# Patient Record
Sex: Male | Born: 1965 | Race: White | Hispanic: No | Marital: Married | Smoking: Former smoker
Health system: Southern US, Community
[De-identification: ages and names within clinical notes are randomized; demographics above are authoritative.]

## PROBLEM LIST (undated history)

## (undated) DIAGNOSIS — K219 Gastro-esophageal reflux disease without esophagitis: Secondary | ICD-10-CM

## (undated) DIAGNOSIS — Z91018 Allergy to other foods: Secondary | ICD-10-CM

## (undated) DIAGNOSIS — I4891 Unspecified atrial fibrillation: Secondary | ICD-10-CM

## (undated) DIAGNOSIS — I10 Essential (primary) hypertension: Secondary | ICD-10-CM

## (undated) DIAGNOSIS — K559 Vascular disorder of intestine, unspecified: Secondary | ICD-10-CM

## (undated) HISTORY — DX: Essential (primary) hypertension: I10

## (undated) HISTORY — DX: Allergy to other foods: Z91.018

## (undated) HISTORY — PX: CARDIAC CATHETERIZATION: SHX172

## (undated) HISTORY — DX: Gastro-esophageal reflux disease without esophagitis: K21.9

---

## 1992-06-10 HISTORY — PX: TONSILLECTOMY: SUR1361

## 2006-06-25 ENCOUNTER — Emergency Department: Payer: Self-pay | Admitting: Emergency Medicine

## 2006-07-19 ENCOUNTER — Inpatient Hospital Stay: Payer: Self-pay | Admitting: Internal Medicine

## 2006-08-19 ENCOUNTER — Ambulatory Visit: Payer: Self-pay | Admitting: Internal Medicine

## 2006-08-26 ENCOUNTER — Ambulatory Visit: Payer: Self-pay | Admitting: Vascular Surgery

## 2006-09-09 ENCOUNTER — Ambulatory Visit: Payer: Self-pay | Admitting: Internal Medicine

## 2006-09-11 ENCOUNTER — Ambulatory Visit: Payer: Self-pay | Admitting: Gastroenterology

## 2006-09-19 ENCOUNTER — Ambulatory Visit: Payer: Self-pay | Admitting: Gastroenterology

## 2006-11-24 ENCOUNTER — Ambulatory Visit: Payer: Self-pay | Admitting: Internal Medicine

## 2006-12-02 ENCOUNTER — Ambulatory Visit: Payer: Self-pay | Admitting: Internal Medicine

## 2007-02-10 ENCOUNTER — Ambulatory Visit: Payer: Self-pay | Admitting: Otolaryngology

## 2007-02-24 ENCOUNTER — Ambulatory Visit: Payer: Self-pay | Admitting: Internal Medicine

## 2007-11-25 ENCOUNTER — Ambulatory Visit: Payer: Self-pay | Admitting: Internal Medicine

## 2008-06-09 ENCOUNTER — Ambulatory Visit: Payer: Self-pay | Admitting: Internal Medicine

## 2008-09-24 ENCOUNTER — Emergency Department: Payer: Self-pay | Admitting: Emergency Medicine

## 2008-09-29 ENCOUNTER — Ambulatory Visit: Payer: Self-pay | Admitting: Internal Medicine

## 2009-09-11 ENCOUNTER — Ambulatory Visit: Payer: Self-pay | Admitting: Internal Medicine

## 2010-08-10 ENCOUNTER — Ambulatory Visit: Payer: Self-pay | Admitting: Internal Medicine

## 2010-09-06 ENCOUNTER — Ambulatory Visit: Payer: Self-pay | Admitting: Internal Medicine

## 2012-04-03 ENCOUNTER — Emergency Department: Payer: Self-pay | Admitting: Emergency Medicine

## 2012-04-03 LAB — CBC
HGB: 15.5 g/dL (ref 13.0–18.0)
MCV: 88 fL (ref 80–100)
Platelet: 281 10*3/uL (ref 150–440)
RBC: 5.24 10*6/uL (ref 4.40–5.90)
RDW: 13.2 % (ref 11.5–14.5)
WBC: 11.6 10*3/uL — ABNORMAL HIGH (ref 3.8–10.6)

## 2012-04-03 LAB — BASIC METABOLIC PANEL
BUN: 17 mg/dL (ref 7–18)
Chloride: 107 mmol/L (ref 98–107)
Creatinine: 0.82 mg/dL (ref 0.60–1.30)
EGFR (African American): >60
EGFR (Non-African Amer.): >60
Osmolality: 288 (ref 275–301)
Potassium: 4.1 mmol/L (ref 3.5–5.1)
Sodium: 143 mmol/L (ref 136–145)

## 2012-04-03 LAB — TROPONIN I
Troponin-I: <0.02 ng/mL
Troponin-I: <0.02 ng/mL

## 2012-04-03 LAB — CK TOTAL AND CKMB (NOT AT ARMC): CK, Total: 103 U/L (ref 35–232)

## 2012-04-03 LAB — PROTIME-INR: INR: 0.9

## 2012-09-09 ENCOUNTER — Ambulatory Visit: Payer: Self-pay | Admitting: Internal Medicine

## 2012-10-01 ENCOUNTER — Ambulatory Visit: Payer: Self-pay | Admitting: Internal Medicine

## 2013-03-13 ENCOUNTER — Emergency Department: Payer: Self-pay | Admitting: Emergency Medicine

## 2013-03-13 LAB — CBC
HCT: 43.6 % (ref 40.0–52.0)
MCV: 88 fL (ref 80–100)
RDW: 13.6 % (ref 11.5–14.5)

## 2013-03-13 LAB — BASIC METABOLIC PANEL
BUN: 19 mg/dL — ABNORMAL HIGH (ref 7–18)
Calcium, Total: 8.9 mg/dL (ref 8.5–10.1)
Chloride: 108 mmol/L — ABNORMAL HIGH (ref 98–107)
EGFR (African American): >60
EGFR (Non-African Amer.): >60
Glucose: 114 mg/dL — ABNORMAL HIGH (ref 65–99)
Osmolality: 283 (ref 275–301)
Sodium: 140 mmol/L (ref 136–145)

## 2013-03-13 LAB — CK TOTAL AND CKMB (NOT AT ARMC)
CK, Total: 104 U/L (ref 35–232)
CK-MB: <0.5 ng/mL — ABNORMAL LOW (ref 0.5–3.6)

## 2013-03-14 LAB — TSH: Thyroid Stimulating Horm: 0.538 u[IU]/mL

## 2013-06-25 ENCOUNTER — Ambulatory Visit: Payer: Self-pay | Admitting: Physician Assistant

## 2013-07-19 ENCOUNTER — Other Ambulatory Visit: Payer: Self-pay | Admitting: Gastroenterology

## 2013-07-20 ENCOUNTER — Ambulatory Visit: Payer: Self-pay | Admitting: Gastroenterology

## 2013-07-20 LAB — CLOSTRIDIUM DIFFICILE(ARMC)

## 2013-07-22 LAB — WBCS, STOOL

## 2013-07-24 LAB — STOOL CULTURE

## 2013-08-09 ENCOUNTER — Ambulatory Visit: Payer: Self-pay | Admitting: Gastroenterology

## 2013-08-12 LAB — PATHOLOGY REPORT

## 2013-09-20 ENCOUNTER — Ambulatory Visit: Payer: Self-pay | Admitting: Internal Medicine

## 2013-09-28 ENCOUNTER — Ambulatory Visit: Payer: Self-pay | Admitting: Internal Medicine

## 2013-11-08 DEATH — deceased

## 2014-05-03 ENCOUNTER — Ambulatory Visit: Payer: Self-pay

## 2014-10-26 ENCOUNTER — Other Ambulatory Visit: Payer: Self-pay | Admitting: Cardiovascular Disease

## 2014-12-15 ENCOUNTER — Encounter: Payer: Self-pay | Admitting: Anesthesiology

## 2014-12-15 ENCOUNTER — Ambulatory Visit: Payer: Federal, State, Local not specified - PPO | Attending: Anesthesiology | Admitting: Anesthesiology

## 2014-12-15 VITALS — BP 118/72 | HR 80 | Temp 98.6°F | Resp 18 | Ht 66.0 in | Wt 225.0 lb

## 2014-12-15 DIAGNOSIS — M79605 Pain in left leg: Secondary | ICD-10-CM | POA: Diagnosis not present

## 2014-12-15 DIAGNOSIS — M545 Low back pain: Secondary | ICD-10-CM | POA: Insufficient documentation

## 2014-12-15 DIAGNOSIS — M47817 Spondylosis without myelopathy or radiculopathy, lumbosacral region: Secondary | ICD-10-CM

## 2014-12-15 DIAGNOSIS — M5136 Other intervertebral disc degeneration, lumbar region: Secondary | ICD-10-CM

## 2014-12-15 MED ORDER — IOPAMIDOL (ISOVUE-M 200) INJECTION 41%
INTRAMUSCULAR | Status: AC
  Administered 2014-12-15: 15:00:00
  Filled 2014-12-15: qty 10

## 2014-12-15 MED ORDER — TRIAMCINOLONE ACETONIDE 40 MG/ML IJ SUSP
INTRAMUSCULAR | Status: AC
  Administered 2014-12-15: 15:00:00
  Filled 2014-12-15: qty 1

## 2014-12-15 MED ORDER — MIDAZOLAM HCL 5 MG/5ML IJ SOLN
INTRAMUSCULAR | Status: AC
  Administered 2014-12-15: 4 mg via INTRAVENOUS
  Filled 2014-12-15: qty 5

## 2014-12-15 MED ORDER — ROPIVACAINE HCL 2 MG/ML IJ SOLN
INTRAMUSCULAR | Status: AC
  Administered 2014-12-15: 15:00:00
  Filled 2014-12-15: qty 10

## 2014-12-15 MED ORDER — LIDOCAINE HCL (PF) 1 % IJ SOLN
INTRAMUSCULAR | Status: AC
  Administered 2014-12-15: 15:00:00
  Filled 2014-12-15: qty 5

## 2014-12-15 MED ORDER — SODIUM CHLORIDE 0.9 % IJ SOLN
INTRAMUSCULAR | Status: AC
  Administered 2014-12-15: 15:00:00
  Filled 2014-12-15: qty 10

## 2014-12-15 NOTE — Progress Notes (Signed)
PROCEDURE PERFORMED: L4L5  Lumbar epidural steroid injection under fluroscopic guidance with moderate sedation.  CC:  Severe right greater than Left LBP  HPI: Douglas Parsons was 49 year old white male with a long-standing history of low back pain. Pain is primarily in the central low back with radiation left lateral posterior lateral leg. He also has pain on prolonged standing with extension of the low back. He rates his pain at a pain score 18 on average and right now it is a 2 and maximizes at about a 7 or 8 generally worse with activity and after activity. Aggravating factors include bending climbing sitting standing for prolonged periods of time as well as walking and rest seems to help his back. He notices fatigue and anterior thigh muscle weakness along with an aching constant burning pain. He's had a previous MRI which was reviewed by me today which is approximately 38-year-old. It shows multilevel degenerative disc disease. There is also evidence of multilevel facet arthropathy and spondylolisthesis.  He has had previous epidural Sterritt injections in the past and what he describes as facet injections as well. These are giving him good relief. A were greater than a year ago and he presents today requesting the same as per referral. His past medical history is significant for hypertension hyperlipidemia depression ischemic colitis pneumonia sleep apnea history of stomach ulcers and coronary atherosclerosis status post cardiac catheter with peripheral vascular disease  Past surgical history is for tonsillectomy and cardiac catheter  Social history he is married  Medications include aspirin cholecalciferol ibuprofen magnesium metoprolol multivitamin and Protonix  Allergies are to Crestor and Lipitor lovastatin and Zocor      Physical Exam:    PERRL, EOMI  Heart RRR   LCTA  Musculoskeletal: With the patient in the standing position he does have significant pain with extension to the both  the left and right sides that does produce pain radiating into the posterior leg but not beyond the knee. He does have a positive straight leg raise on the left side. His muscle tone and bulk is good. He does have slightly diminished strength with flexion at the bilateral hips rated a 5 minus over 5. Assessment:  PLAN:   1. Will plan on LESI today with return to clinic in one month.  Risks and benefits of the procedure discussed in full detail. All Questions answered.    2.  The patient is to return for reevaluation in approximately one month. They have been instructed to continue follow-up with their primary care physician regarding their baseline medical care.  3..  Cont back stretching and strengthening exercises.    4.   We'll also consider facet injection in his future visits.  Procedure:      Procedure:  LESI:  NOTE: The risks, benefits, and expectations of the procedure have been discussed and explained to the patient who was understanding and in agreement with suggested treatment plan. No guarantees were made.  DESCRIPTION OF PROCEDURE: Lumbar epidural steroid injection with IV 4 mg Versed, EKG, blood pressure, pulse, and pulse oximetry monitoring. The procedure was performed with the patient in the prone position under fluoroscopic guidance. A local anesthetic skin wheal of 1.5% plain lidocaine at L4-5 was performed at the appropriate site after fluoroscopic identifictation  Using strict aseptic technique, I then advanced an 18-gauge Tuohy epidural needle in the midline via loss-of-resistance  with minimal advancement past loss-of-resistance there was clear discharge consistent with CSF. I have ordered at this level and then proceeded to the  L5-S1 level. I once again advanced the needle under strict aseptic technique without difficulty. Needle confirmation was both in the AP and lateral views. I then injected 2 cc of Isovue-200 getting good epidural spread primarily left-sided with what  appears to be the L4 and L5 nerve roots. There was also negative aspiration for heme or CSF.  A total of 5 mL of Preservative-Free normal saline with 40 mg of Kenalog and 1cc  bupivacaine 0.25 percent was injected incrementally via the  epidurally placed needle. Needle removed. The patient tolerated the injection well.   @Douglas  [name], MD@

## 2014-12-15 NOTE — Progress Notes (Signed)
Safety precautions to be maintained throughout the outpatient stay will include: orient to surroundings, keep bed in low position, maintain call bell within reach at all times, provide assistance with transfer out of bed and ambulation.  

## 2014-12-15 NOTE — Patient Instructions (Signed)
GENERAL RISKS AND COMPLICATIONS  What are the risk, side effects and possible complications? Generally speaking, most procedures are safe.  However, with any procedure there are risks, side effects, and the possibility of complications.  The risks and complications are dependent upon the sites that are lesioned, or the type of nerve block to be performed.  The closer the procedure is to the spine, the more serious the risks are.  Great care is taken when placing the radio frequency needles, block needles or lesioning probes, but sometimes complications can occur. 1. Infection: Any time there is an injection through the skin, there is a risk of infection.  This is why sterile conditions are used for these blocks.  There are four possible types of infection. 1. Localized skin infection. 2. Central Nervous System Infection-This can be in the form of Meningitis, which can be deadly. 3. Epidural Infections-This can be in the form of an epidural abscess, which can cause pressure inside of the spine, causing compression of the spinal cord with subsequent paralysis. This would require an emergency surgery to decompress, and there are no guarantees that the patient would recover from the paralysis. 4. Discitis-This is an infection of the intervertebral discs.  It occurs in about 1% of discography procedures.  It is difficult to treat and it may lead to surgery.        2. Pain: the needles have to go through skin and soft tissues, will cause soreness.       3. Damage to internal structures:  The nerves to be lesioned may be near blood vessels or    other nerves which can be potentially damaged.       4. Bleeding: Bleeding is more common if the patient is taking blood thinners such as  aspirin, Coumadin, Ticiid, Plavix, etc., or if he/she have some genetic predisposition  such as hemophilia. Bleeding into the spinal canal can cause compression of the spinal  cord with subsequent paralysis.  This would require an  emergency surgery to  decompress and there are no guarantees that the patient would recover from the  paralysis.       5. Pneumothorax:  Puncturing of a lung is a possibility, every time a needle is introduced in  the area of the chest or upper back.  Pneumothorax refers to free air around the  collapsed lung(s), inside of the thoracic cavity (chest cavity).  Another two possible  complications related to a similar event would include: Hemothorax and Chylothorax.   These are variations of the Pneumothorax, where instead of air around the collapsed  lung(s), you may have blood or chyle, respectively.       6. Spinal headaches: They may occur with any procedures in the area of the spine.       7. Persistent CSF (Cerebro-Spinal Fluid) leakage: This is a rare problem, but may occur  with prolonged intrathecal or epidural catheters either due to the formation of a fistulous  track or a dural tear.       8. Nerve damage: By working so close to the spinal cord, there is always a possibility of  nerve damage, which could be as serious as a permanent spinal cord injury with  paralysis.       9. Death:  Although rare, severe deadly allergic reactions known as "Anaphylactic  reaction" can occur to any of the medications used.      10. Worsening of the symptoms:  We can always make thing worse.    What are the chances of something like this happening? Chances of any of this occuring are extremely low.  By statistics, you have more of a chance of getting killed in a motor vehicle accident: while driving to the hospital than any of the above occurring .  Nevertheless, you should be aware that they are possibilities.  In general, it is similar to taking a shower.  Everybody knows that you can slip, hit your head and get killed.  Does that mean that you should not shower again?  Nevertheless always keep in mind that statistics do not mean anything if you happen to be on the wrong side of them.  Even if a procedure has a 1  (one) in a 1,000,000 (million) chance of going wrong, it you happen to be that one..Also, keep in mind that by statistics, you have more of a chance of having something go wrong when taking medications.  Who should not have this procedure? If you are on a blood thinning medication (e.g. Coumadin, Plavix, see list of "Blood Thinners"), or if you have an active infection going on, you should not have the procedure.  If you are taking any blood thinners, please inform your physician.  How should I prepare for this procedure?  Do not eat or drink anything at least six hours prior to the procedure.  Bring a driver with you .  It cannot be a taxi.  Come accompanied by an adult that can drive you back, and that is strong enough to help you if your legs get weak or numb from the local anesthetic.  Take all of your medicines the morning of the procedure with just enough water to swallow them.  If you have diabetes, make sure that you are scheduled to have your procedure done first thing in the morning, whenever possible.  If you have diabetes, take only half of your insulin dose and notify our nurse that you have done so as soon as you arrive at the clinic.  If you are diabetic, but only take blood sugar pills (oral hypoglycemic), then do not take them on the morning of your procedure.  You may take them after you have had the procedure.  Do not take aspirin or any aspirin-containing medications, at least eleven (11) days prior to the procedure.  They may prolong bleeding.  Wear loose fitting clothing that may be easy to take off and that you would not mind if it got stained with Betadine or blood.  Do not wear any jewelry or perfume  Remove any nail coloring.  It will interfere with some of our monitoring equipment.  NOTE: Remember that this is not meant to be interpreted as a complete list of all possible complications.  Unforeseen problems may occur.  BLOOD THINNERS The following drugs  contain aspirin or other products, which can cause increased bleeding during surgery and should not be taken for 2 weeks prior to and 1 week after surgery.  If you should need take something for relief of minor pain, you may take acetaminophen which is found in Tylenol,m Datril, Anacin-3 and Panadol. It is not blood thinner. The products listed below are.  Do not take any of the products listed below in addition to any listed on your instruction sheet.  A.P.C or A.P.C with Codeine Codeine Phosphate Capsules #3 Ibuprofen Ridaura  ABC compound Congesprin Imuran rimadil  Advil Cope Indocin Robaxisal  Alka-Seltzer Effervescent Pain Reliever and Antacid Coricidin or Coricidin-D  Indomethacin Rufen    Alka-Seltzer plus Cold Medicine Cosprin Ketoprofen S-A-C Tablets  Anacin Analgesic Tablets or Capsules Coumadin Korlgesic Salflex  Anacin Extra Strength Analgesic tablets or capsules CP-2 Tablets Lanoril Salicylate  Anaprox Cuprimine Capsules Levenox Salocol  Anexsia-D Dalteparin Magan Salsalate  Anodynos Darvon compound Magnesium Salicylate Sine-off  Ansaid Dasin Capsules Magsal Sodium Salicylate  Anturane Depen Capsules Marnal Soma  APF Arthritis pain formula Dewitt's Pills Measurin Stanback  Argesic Dia-Gesic Meclofenamic Sulfinpyrazone  Arthritis Bayer Timed Release Aspirin Diclofenac Meclomen Sulindac  Arthritis pain formula Anacin Dicumarol Medipren Supac  Analgesic (Safety coated) Arthralgen Diffunasal Mefanamic Suprofen  Arthritis Strength Bufferin Dihydrocodeine Mepro Compound Suprol  Arthropan liquid Dopirydamole Methcarbomol with Aspirin Synalgos  ASA tablets/Enseals Disalcid Micrainin Tagament  Ascriptin Doan's Midol Talwin  Ascriptin A/D Dolene Mobidin Tanderil  Ascriptin Extra Strength Dolobid Moblgesic Ticlid  Ascriptin with Codeine Doloprin or Doloprin with Codeine Momentum Tolectin  Asperbuf Duoprin Mono-gesic Trendar  Aspergum Duradyne Motrin or Motrin IB Triminicin  Aspirin  plain, buffered or enteric coated Durasal Myochrisine Trigesic  Aspirin Suppositories Easprin Nalfon Trillsate  Aspirin with Codeine Ecotrin Regular or Extra Strength Naprosyn Uracel  Atromid-S Efficin Naproxen Ursinus  Auranofin Capsules Elmiron Neocylate Vanquish  Axotal Emagrin Norgesic Verin  Azathioprine Empirin or Empirin with Codeine Normiflo Vitamin E  Azolid Emprazil Nuprin Voltaren  Bayer Aspirin plain, buffered or children's or timed BC Tablets or powders Encaprin Orgaran Warfarin Sodium  Buff-a-Comp Enoxaparin Orudis Zorpin  Buff-a-Comp with Codeine Equegesic Os-Cal-Gesic   Buffaprin Excedrin plain, buffered or Extra Strength Oxalid   Bufferin Arthritis Strength Feldene Oxphenbutazone   Bufferin plain or Extra Strength Feldene Capsules Oxycodone with Aspirin   Bufferin with Codeine Fenoprofen Fenoprofen Pabalate or Pabalate-SF   Buffets II Flogesic Panagesic   Buffinol plain or Extra Strength Florinal or Florinal with Codeine Panwarfarin   Buf-Tabs Flurbiprofen Penicillamine   Butalbital Compound Four-way cold tablets Penicillin   Butazolidin Fragmin Pepto-Bismol   Carbenicillin Geminisyn Percodan   Carna Arthritis Reliever Geopen Persantine   Carprofen Gold's salt Persistin   Chloramphenicol Goody's Phenylbutazone   Chloromycetin Haltrain Piroxlcam   Clmetidine heparin Plaquenil   Cllnoril Hyco-pap Ponstel   Clofibrate Hydroxy chloroquine Propoxyphen         Before stopping any of these medications, be sure to consult the physician who ordered them.  Some, such as Coumadin (Warfarin) are ordered to prevent or treat serious conditions such as "deep thrombosis", "pumonary embolisms", and other heart problems.  The amount of time that you may need off of the medication may also vary with the medication and the reason for which you were taking it.  If you are taking any of these medications, please make sure you notify your pain physician before you undergo any  procedures.         Epidural Steroid Injection Patient Information  Description: The epidural space surrounds the nerves as they exit the spinal cord.  In some patients, the nerves can be compressed and inflamed by a bulging disc or a tight spinal canal (spinal stenosis).  By injecting steroids into the epidural space, we can bring irritated nerves into direct contact with a potentially helpful medication.  These steroids act directly on the irritated nerves and can reduce swelling and inflammation which often leads to decreased pain.  Epidural steroids may be injected anywhere along the spine and from the neck to the low back depending upon the location of your pain.   After numbing the skin with local anesthetic (like Novocaine), a small needle is passed   into the epidural space slowly.  You may experience a sensation of pressure while this is being done.  The entire block usually last less than 10 minutes.  Conditions which may be treated by epidural steroids:   Low back and leg pain  Neck and arm pain  Spinal stenosis  Post-laminectomy syndrome  Herpes zoster (shingles) pain  Pain from compression fractures  Preparation for the injection:  1. Do not eat any solid food or dairy products within 6 hours of your appointment.  2. You may drink clear liquids up to 2 hours before appointment.  Clear liquids include water, black coffee, juice or soda.  No milk or cream please. 3. You may take your regular medication, including pain medications, with a sip of water before your appointment  Diabetics should hold regular insulin (if taken separately) and take 1/2 normal NPH dos the morning of the procedure.  Carry some sugar containing items with you to your appointment. 4. A driver must accompany you and be prepared to drive you home after your procedure.  5. Bring all your current medications with your. 6. An IV may be inserted and sedation may be given at the discretion of the  physician.   7. A blood pressure cuff, EKG and other monitors will often be applied during the procedure.  Some patients may need to have extra oxygen administered for a short period. 8. You will be asked to provide medical information, including your allergies, prior to the procedure.  We must know immediately if you are taking blood thinners (like Coumadin/Warfarin)  Or if you are allergic to IV iodine contrast (dye). We must know if you could possible be pregnant.  Possible side-effects:  Bleeding from needle site  Infection (rare, may require surgery)  Nerve injury (rare)  Numbness & tingling (temporary)  Difficulty urinating (rare, temporary)  Spinal headache ( a headache worse with upright posture)  Light -headedness (temporary)  Pain at injection site (several days)  Decreased blood pressure (temporary)  Weakness in arm/leg (temporary)  Pressure sensation in back/neck (temporary)  Call if you experience:  Fever/chills associated with headache or increased back/neck pain.  Headache worsened by an upright position.  New onset weakness or numbness of an extremity below the injection site  Hives or difficulty breathing (go to the emergency room)  Inflammation or drainage at the infection site  Severe back/neck pain  Any new symptoms which are concerning to you  Please note:  Although the local anesthetic injected can often make your back or neck feel good for several hours after the injection, the pain will likely return.  It takes 3-7 days for steroids to work in the epidural space.  You may not notice any pain relief for at least that one week.  If effective, we will often do a series of three injections spaced 3-6 weeks apart to maximally decrease your pain.  After the initial series, we generally will wait several months before considering a repeat injection of the same type.  If you have any questions, please call [phone] [facility] 

## 2014-12-29 ENCOUNTER — Other Ambulatory Visit: Payer: Self-pay | Admitting: Internal Medicine

## 2014-12-29 DIAGNOSIS — R55 Syncope and collapse: Secondary | ICD-10-CM

## 2014-12-29 DIAGNOSIS — M545 Low back pain: Secondary | ICD-10-CM

## 2014-12-29 DIAGNOSIS — G8929 Other chronic pain: Secondary | ICD-10-CM

## 2015-01-02 ENCOUNTER — Ambulatory Visit
Admission: RE | Admit: 2015-01-02 | Discharge: 2015-01-02 | Disposition: A | Discharge status: Home / Self Care | Payer: Federal, State, Local not specified - PPO | Source: Ambulatory Visit | Attending: Internal Medicine | Admitting: Internal Medicine

## 2015-01-02 DIAGNOSIS — G8929 Other chronic pain: Secondary | ICD-10-CM

## 2015-01-02 DIAGNOSIS — M545 Low back pain: Secondary | ICD-10-CM

## 2015-01-02 DIAGNOSIS — R55 Syncope and collapse: Secondary | ICD-10-CM | POA: Diagnosis not present

## 2015-01-11 ENCOUNTER — Ambulatory Visit: Payer: Federal, State, Local not specified - PPO | Admitting: Anesthesiology

## 2015-01-12 ENCOUNTER — Ambulatory Visit: Payer: Federal, State, Local not specified - PPO | Attending: Anesthesiology | Admitting: Anesthesiology

## 2015-01-12 ENCOUNTER — Encounter: Payer: Self-pay | Admitting: Anesthesiology

## 2015-01-12 VITALS — BP 107/54 | HR 67 | Temp 98.4°F | Resp 16 | Ht 66.0 in | Wt 225.0 lb

## 2015-01-12 DIAGNOSIS — M5430 Sciatica, unspecified side: Secondary | ICD-10-CM

## 2015-01-12 DIAGNOSIS — M5440 Lumbago with sciatica, unspecified side: Secondary | ICD-10-CM | POA: Insufficient documentation

## 2015-01-12 DIAGNOSIS — M79605 Pain in left leg: Secondary | ICD-10-CM | POA: Diagnosis present

## 2015-01-12 DIAGNOSIS — M5136 Other intervertebral disc degeneration, lumbar region: Secondary | ICD-10-CM | POA: Diagnosis not present

## 2015-01-12 MED ORDER — IOHEXOL 180 MG/ML  SOLN
INTRAMUSCULAR | Status: AC
  Administered 2015-01-12: 3 mL
  Filled 2015-01-12: qty 20

## 2015-01-12 MED ORDER — ROPIVACAINE HCL 2 MG/ML IJ SOLN
INTRAMUSCULAR | Status: AC
  Administered 2015-01-12: 1 mL
  Filled 2015-01-12: qty 10

## 2015-01-12 MED ORDER — LIDOCAINE HCL (PF) 1 % IJ SOLN
INTRAMUSCULAR | Status: AC
  Administered 2015-01-12: 5 mL
  Filled 2015-01-12: qty 5

## 2015-01-12 MED ORDER — MIDAZOLAM HCL 5 MG/5ML IJ SOLN
INTRAMUSCULAR | Status: AC
  Administered 2015-01-12: 3 mg via INTRAVENOUS
  Filled 2015-01-12: qty 5

## 2015-01-12 MED ORDER — TRIAMCINOLONE ACETONIDE 40 MG/ML IJ SUSP
INTRAMUSCULAR | Status: AC
  Administered 2015-01-12: 40 mg
  Filled 2015-01-12: qty 1

## 2015-01-12 NOTE — Patient Instructions (Signed)
Pain Management Discharge Instructions  General Discharge Instructions :  If you need to reach your doctor call: Monday-Friday 8:00 am - 4:00 pm at [phone] or toll free [phone].  After clinic hours [phone] to have operator reach doctor.  Bring all of your medication bottles to all your appointments in the pain clinic.  To cancel or reschedule your appointment with Pain Management please remember to call 24 hours in advance to avoid a fee.  Refer to the educational materials which you have been given on: General Risks, I had my Procedure. Discharge Instructions, Post Sedation.  Post Procedure Instructions:  The drugs you were given will stay in your system until tomorrow, so for the next 24 hours you should not drive, make any legal decisions or drink any alcoholic beverages.  You may eat anything you prefer, but it is better to start with liquids then soups and crackers, and gradually work up to solid foods.  Please notify your doctor immediately if you have any unusual bleeding, trouble breathing or pain that is not related to your normal pain.  Depending on the type of procedure that was done, some parts of your body may feel week and/or numb.  This usually clears up by tonight or the next day.  Walk with the use of an assistive device or accompanied by an adult for the 24 hours.  You may use ice on the affected area for the first 24 hours.  Put ice in a Ziploc bag and cover with a towel and place against area 15 minutes on 15 minutes off.  You may switch to heat after 24 hours.Epidural Steroid Injection Patient Information  Description: The epidural space surrounds the nerves as they exit the spinal cord.  In some patients, the nerves can be compressed and inflamed by a bulging disc or a tight spinal canal (spinal stenosis).  By injecting steroids into the epidural space, we can bring irritated nerves into direct contact with a potentially helpful medication.  These  steroids act directly on the irritated nerves and can reduce swelling and inflammation which often leads to decreased pain.  Epidural steroids may be injected anywhere along the spine and from the neck to the low back depending upon the location of your pain.   After numbing the skin with local anesthetic (like Novocaine), a small needle is passed into the epidural space slowly.  You may experience a sensation of pressure while this is being done.  The entire block usually last less than 10 minutes.  Conditions which may be treated by epidural steroids:   Low back and leg pain  Neck and arm pain  Spinal stenosis  Post-laminectomy syndrome  Herpes zoster (shingles) pain  Pain from compression fractures  Preparation for the injection:  1. Do not eat any solid food or dairy products within 6 hours of your appointment.  2. You may drink clear liquids up to 2 hours before appointment.  Clear liquids include water, black coffee, juice or soda.  No milk or cream please. 3. You may take your regular medication, including pain medications, with a sip of water before your appointment  Diabetics should hold regular insulin (if taken separately) and take 1/2 normal NPH dos the morning of the procedure.  Carry some sugar containing items with you to your appointment. 4. A driver must accompany you and be prepared to drive you home after your procedure.  5. Bring all your current medications with your. 6. An IV may be inserted and   sedation may be given at the discretion of the physician.   7. A blood pressure cuff, EKG and other monitors will often be applied during the procedure.  Some patients may need to have extra oxygen administered for a short period. 8. You will be asked to provide medical information, including your allergies, prior to the procedure.  We must know immediately if you are taking blood thinners (like Coumadin/Warfarin)  Or if you are allergic to IV iodine contrast (dye). We must  know if you could possible be pregnant.  Possible side-effects:  Bleeding from needle site  Infection (rare, may require surgery)  Nerve injury (rare)  Numbness & tingling (temporary)  Difficulty urinating (rare, temporary)  Spinal headache ( a headache worse with upright posture)  Light -headedness (temporary)  Pain at injection site (several days)  Decreased blood pressure (temporary)  Weakness in arm/leg (temporary)  Pressure sensation in back/neck (temporary)  Call if you experience:  Fever/chills associated with headache or increased back/neck pain.  Headache worsened by an upright position.  New onset weakness or numbness of an extremity below the injection site  Hives or difficulty breathing (go to the emergency room)  Inflammation or drainage at the infection site  Severe back/neck pain  Any new symptoms which are concerning to you  Please note:  Although the local anesthetic injected can often make your back or neck feel good for several hours after the injection, the pain will likely return.  It takes 3-7 days for steroids to work in the epidural space.  You may not notice any pain relief for at least that one week.  If effective, we will often do a series of three injections spaced 3-6 weeks apart to maximally decrease your pain.  After the initial series, we generally will wait several months before considering a repeat injection of the same type.  If you have any questions, please call [phone]  [facility] 

## 2015-01-12 NOTE — Progress Notes (Signed)
Safety precautions to be maintained throughout the outpatient stay will include: orient to surroundings, keep bed in low position, maintain call bell within reach at all times, provide assistance with transfer out of bed and ambulation.  Patient will call at the end of month  For a follow up appointment.

## 2015-01-13 ENCOUNTER — Telehealth: Payer: Self-pay

## 2015-01-13 NOTE — Telephone Encounter (Signed)
Patient states he is doing ok.  Instructed to use heat today for complaint of stiffness. Instructed to call us for any questions or concerns.

## 2015-01-17 NOTE — Progress Notes (Signed)
PROCEDURE PERFORMED: Lumbar epidural steroid injection L4-L5 under fluroscopic guidance with moderate sedation.  CC:  Left lower extremity pain with radiation into the left thigh and calf  HPI:  Douglas Parsons presents for reevaluation today. He was last seen approximately 1 month ago and has had a 85% reduction in his left lower extremity pain. He is still having some low back pain but it is more tolerable he is sleeping better and requiring less medication management for control of his pain. He is doing his physical therapy exercises and feels he is making good progress. He has had some recurrence of the left calf and thigh pain but that is significantly diminished and he presents today requesting a repeat epidural injection since he feels he is making good progress. Injections have enabled him to more effectively do his physical therapy exercises  Physical Exam:    PERRL, EOMI  Heart RRR   LCTA  Musculoskeletal: He has some paraspinous muscle tenderness but no overt trigger points. He still has a mild straight leg raise affected on the left side but this appears much improved. He has minimal pain on extension at the low back.  Assessment:  1. Degenerative disc disease  2. Sciatica affecting the L4 radicular region of the left leg that is improving  3 myofascial low back pain  PLAN:   1. Medications: We will continue presently prescribed medications.  2.  We will proceed with a repeat epidural injection today as reviewed with the patient. All the risks and benefits of an reviewed all questions answered no guarantees were made  3. The patient is to return for reevaluation in approximately one month. They have been instructed to continue follow-up with their primary care physician regarding their baseline medical care.    Procedure:  LESI:  NOTE: The risks, benefits, and expectations of the procedure have been discussed and explained to the patient who was understanding and in agreement  with suggested treatment plan. No guarantees were made.  DESCRIPTION OF PROCEDURE: Lumbar epidural steroid injection with IV Versed, EKG, blood pressure, pulse, and pulse oximetry monitoring. The procedure was performed with the patient in the prone position under fluoroscopic guidance. A local anesthetic skin wheal of 1.5% plain lidocaine was performed at the appropriate site for paramedian approach at L4 and L5 after fluoroscopic identifictation  Using strict aseptic technique, I then advanced an 18-gauge Tuohy epidural needle in the midline via loss-of-resistance  Technique. There was negative aspiration for negative aspiration for heme or  CSF.  I then confirmed position with both AP and Lateral fluoroscan. I also injected 2 cc of Isovue yielding good epidural spread primarily left-sided. A total of 5 mL of Preservative-Free normal saline with 40 mg of Kenalog and 1cc Ropicaine 0.2 percent was injected incrementally via the  epidurally placed needle. Needle removed. The patient tolerated the injection well.    Pernell Dupre, MD@

## 2015-04-17 ENCOUNTER — Ambulatory Visit: Payer: Federal, State, Local not specified - PPO | Attending: Anesthesiology | Admitting: Anesthesiology

## 2015-04-17 VITALS — BP 127/68 | HR 69 | Temp 98.2°F | Resp 18 | Wt 218.0 lb

## 2015-04-17 DIAGNOSIS — M545 Low back pain: Secondary | ICD-10-CM | POA: Diagnosis not present

## 2015-04-17 DIAGNOSIS — M5432 Sciatica, left side: Secondary | ICD-10-CM | POA: Insufficient documentation

## 2015-04-17 DIAGNOSIS — M5430 Sciatica, unspecified side: Secondary | ICD-10-CM

## 2015-04-17 DIAGNOSIS — M79605 Pain in left leg: Secondary | ICD-10-CM | POA: Diagnosis present

## 2015-04-17 DIAGNOSIS — Z87891 Personal history of nicotine dependence: Secondary | ICD-10-CM | POA: Diagnosis not present

## 2015-04-17 DIAGNOSIS — M47817 Spondylosis without myelopathy or radiculopathy, lumbosacral region: Secondary | ICD-10-CM

## 2015-04-17 DIAGNOSIS — M5136 Other intervertebral disc degeneration, lumbar region: Secondary | ICD-10-CM | POA: Diagnosis not present

## 2015-04-17 MED ORDER — TRIAMCINOLONE ACETONIDE 40 MG/ML IJ SUSP
INTRAMUSCULAR | Status: AC
  Administered 2015-04-17: 16:00:00
  Filled 2015-04-17: qty 1

## 2015-04-17 MED ORDER — SODIUM CHLORIDE 0.9 % IJ SOLN
INTRAMUSCULAR | Status: AC
  Administered 2015-04-17: 16:00:00
  Filled 2015-04-17: qty 10

## 2015-04-17 MED ORDER — MIDAZOLAM HCL 5 MG/5ML IJ SOLN
INTRAMUSCULAR | Status: AC
  Administered 2015-04-17: 4 mg via INTRAVENOUS
  Filled 2015-04-17: qty 5

## 2015-04-17 MED ORDER — LIDOCAINE HCL (PF) 1 % IJ SOLN
INTRAMUSCULAR | Status: AC
  Administered 2015-04-17: 16:00:00
  Filled 2015-04-17: qty 5

## 2015-04-17 MED ORDER — ROPIVACAINE HCL 2 MG/ML IJ SOLN
INTRAMUSCULAR | Status: AC
  Administered 2015-04-17: 16:00:00
  Filled 2015-04-17: qty 10

## 2015-04-17 MED ORDER — FENTANYL CITRATE (PF) 100 MCG/2ML IJ SOLN
INTRAMUSCULAR | Status: AC
  Administered 2015-04-17: 25 ug
  Filled 2015-04-17: qty 2

## 2015-04-17 NOTE — Progress Notes (Signed)
Safety precautions to be maintained throughout the outpatient stay will include: orient to surroundings, keep bed in low position, maintain call bell within reach at all times, provide assistance with transfer out of bed and ambulation.  

## 2015-04-17 NOTE — Patient Instructions (Signed)
Epidural Steroid Injection An epidural steroid injection is given to relieve pain in your neck, back, or legs that is caused by the irritation or swelling of a nerve root. This procedure involves injecting a steroid and numbing medicine (anesthetic) into the epidural space. The epidural space is the space between the outer covering of your spinal cord and the bones that form your backbone (vertebra).  LET YOUR HEALTH CARE PROVIDER KNOW ABOUT:   Any allergies you have.  All medicines you are taking, including vitamins, herbs, eye drops, creams, and over-the-counter medicines such as aspirin.  Previous problems you or members of your family have had with the use of anesthetics.  Any blood disorders or blood clotting disorders you have.  Previous surgeries you have had.  Medical conditions you have. RISKS AND COMPLICATIONS Generally, this is a safe procedure. However, as with any procedure, complications can occur. Possible complications of epidural steroid injection include:  Headache.  Bleeding.  Infection.  Allergic reaction to the medicines.  Damage to your nerves. The response to this procedure depends on the underlying cause of the pain and its duration. People who have long-term (chronic) pain are less likely to benefit from epidural steroids than are those people whose pain comes on strong and suddenly. BEFORE THE PROCEDURE   Ask your health care provider about changing or stopping your regular medicines. You may be advised to stop taking blood-thinning medicines a few days before the procedure.  You may be given medicines to reduce anxiety.  Arrange for someone to take you home after the procedure. PROCEDURE   You will remain awake during the procedure. You may receive medicine to make you relaxed.  You will be asked to lie on your stomach.  The injection site will be cleaned.  The injection site will be numbed with a medicine (local anesthetic).  A needle will be  injected through your skin into the epidural space.  Your health care provider will use an X-ray machine to ensure that the steroid is delivered closest to the affected nerve. You may have minimal discomfort at this time.  Once the needle is in the right position, the local anesthetic and the steroid will be injected into the epidural space.  The needle will then be removed and a bandage will be applied to the injection site. AFTER THE PROCEDURE   You may be monitored for a short time before you go home.  You may feel weakness or numbness in your arm or leg, which disappears within hours.  You may be allowed to eat, drink, and take your regular medicine.  You may have soreness at the site of the injection.   This information is not intended to replace advice given to you by your health care provider. Make sure you discuss any questions you have with your health care provider.   Document Released: 09/03/2007 Document Revised: 01/27/2013 Document Reviewed: 11/13/2012 Elsevier Interactive Patient Education 2016 [employer] Pain Management Discharge Instructions  General Discharge Instructions :  If you need to reach your doctor call: Monday-Friday 8:00 am - 4:00 pm at [phone] or toll free [phone].  After clinic hours [phone] to have operator reach doctor.  Bring all of your medication bottles to all your appointments in the pain clinic.  To cancel or reschedule your appointment with Pain Management please remember to call 24 hours in advance to avoid a fee.  Refer to the educational materials which you have been given on: General Risks, I had my Procedure.   Discharge Instructions, Post Sedation.  Post Procedure Instructions:  The drugs you were given will stay in your system until tomorrow, so for the next 24 hours you should not drive, make any legal decisions or drink any alcoholic beverages.  You may eat anything you prefer, but it is better to start with  liquids then soups and crackers, and gradually work up to solid foods.  Please notify your doctor immediately if you have any unusual bleeding, trouble breathing or pain that is not related to your normal pain.  Depending on the type of procedure that was done, some parts of your body may feel week and/or numb.  This usually clears up by tonight or the next day.  Walk with the use of an assistive device or accompanied by an adult for the 24 hours.  You may use ice on the affected area for the first 24 hours.  Put ice in a Ziploc bag and cover with a towel and place against area 15 minutes on 15 minutes off.  You may switch to heat after 24 hours. 

## 2015-04-18 NOTE — Progress Notes (Signed)
PROCEDURE PERFORMED: Lumbar epidural steroid injection L4-L5 under fluroscopic guidance with moderate sedation.  CC:  Left lower extremity pain with radiation into the left thigh and calf  HPI:  Presents for evaluation last seen approximately 3 months ago. He did very well for approximately 2 months following the injection with 90% relief in his low back pain and 100% relief in his left lower extremity pain. Otherwise is in his usual state of health and no significant changes in lower extremity strength or function or bowel bladder function. He has had recurrence of the same quality characteristic and distribution of pain as previously documented though not as intense as upon his initial presentation. He has not been doing regular routine stretching strengthening exercises as initially discussed. He does report that he has stopped smoking Physical Exam:    PERRL, EOMI  Heart RRR   LCTA  Musculoskeletal: He has some paraspinous muscle tenderness but no overt trigger points. He still has a mild straight leg raise affected on the left side but this appears much improved with greater flexibility He has minimal pain on extension at the low back. No significant pain on extension of the back with right lateral rotation and minimal pain with left lateral rotation but good range of motion.  Assessment:  1. Degenerative disc disease  2. Sciatica affecting the L4 radicular region of the left leg that is improving  3 myofascial low back pain  PLAN:   1. Medications: We will continue presently prescribed medications.  2.  We will proceed with a repeat epidural injection today as reviewed with the patient. All the risks and benefits of an reviewed all questions answered no guarantees were made  3. The patient is to return for reevaluation in approximately one month. He did extend longer than we had hoped for prior to his third epidural injection being that he was showing good improvement. We will have  him return to clinic in 1 month for reevaluation and possible repeat epidural versus facet injection at that time for some findings consistent with facet genic pain on examination today. We have also discussed the need for dedicated stretching strengthening exercises with core strengthening. He has been instructed to continue follow-up with their primary care physician regarding their baseline medical care.    Procedure:  LESI:  NOTE: The risks, benefits, and expectations of the procedure have been discussed and explained to the patient who was understanding and in agreement with suggested treatment plan. No guarantees were made.  DESCRIPTION OF PROCEDURE: Lumbar epidural steroid injection with IV Versed, EKG, blood pressure, pulse, and pulse oximetry monitoring. The procedure was performed with the patient in the prone position under fluoroscopic guidance. A local anesthetic skin wheal of 1.5% plain lidocaine was performed at the appropriate site for paramedian approach at L4 and L5 after fluoroscopic identifictation  Using strict aseptic technique, I then advanced an 18-gauge Tuohy epidural needle in the midline via loss-of-resistance  Technique. There was negative aspiration for negative aspiration for heme or  CSF.  I then confirmed position with both AP and Lateral fluoroscan. I also injected 2 cc of Omnipaque yielding good epidural spread primarily left-sided. A total of 5 mL of Preservative-Free normal saline with 40 mg of Kenalog and 1cc Ropicaine 0.2 percent was injected incrementally via the  epidurally placed needle. Needle removed. The patient tolerated the injection well.   @James  [name], MD@

## 2015-05-16 ENCOUNTER — Encounter: Payer: Self-pay | Admitting: Anesthesiology

## 2015-05-16 ENCOUNTER — Ambulatory Visit: Payer: Federal, State, Local not specified - PPO | Attending: Anesthesiology | Admitting: Anesthesiology

## 2015-05-16 VITALS — BP 116/60 | HR 68 | Temp 98.7°F | Resp 18 | Ht 66.0 in | Wt 220.0 lb

## 2015-05-16 DIAGNOSIS — M5137 Other intervertebral disc degeneration, lumbosacral region: Secondary | ICD-10-CM

## 2015-05-16 DIAGNOSIS — M5136 Other intervertebral disc degeneration, lumbar region: Secondary | ICD-10-CM | POA: Diagnosis not present

## 2015-05-16 DIAGNOSIS — M5134 Other intervertebral disc degeneration, thoracic region: Secondary | ICD-10-CM | POA: Insufficient documentation

## 2015-05-16 DIAGNOSIS — M545 Low back pain: Secondary | ICD-10-CM | POA: Insufficient documentation

## 2015-05-16 DIAGNOSIS — M47817 Spondylosis without myelopathy or radiculopathy, lumbosacral region: Secondary | ICD-10-CM

## 2015-05-16 DIAGNOSIS — M5430 Sciatica, unspecified side: Secondary | ICD-10-CM | POA: Diagnosis not present

## 2015-05-16 DIAGNOSIS — M79605 Pain in left leg: Secondary | ICD-10-CM | POA: Diagnosis present

## 2015-05-16 MED ORDER — TRIAMCINOLONE ACETONIDE 40 MG/ML IJ SUSP
INTRAMUSCULAR | Status: AC
  Administered 2015-05-16: 14:00:00
  Filled 2015-05-16: qty 1

## 2015-05-16 MED ORDER — MIDAZOLAM HCL 5 MG/5ML IJ SOLN
INTRAMUSCULAR | Status: AC
  Administered 2015-05-16: 5 mg via INTRAVENOUS
  Filled 2015-05-16: qty 5

## 2015-05-16 MED ORDER — ROPIVACAINE HCL 2 MG/ML IJ SOLN
INTRAMUSCULAR | Status: AC
  Administered 2015-05-16: 14:00:00
  Filled 2015-05-16: qty 10

## 2015-05-16 NOTE — Patient Instructions (Signed)
Facet Joint Block The facet joints connect the bones of the spine (vertebrae). They make it possible for you to bend, twist, and make other movements with your spine. They also prevent you from overbending, overtwisting, and making other excessive movements.  A facet joint block is a procedure where a numbing medicine (anesthetic) is injected into a facet joint. Often, a type of anti-inflammatory medicine called a steroid is also injected. A facet joint block may be done for two reasons:   Diagnosis. A facet joint block may be done as a test to see whether neck or back pain is caused by a worn-down or infected facet joint. If the pain gets better after a facet joint block, it means the pain is probably coming from the facet joint. If the pain does not get better, it means the pain is probably not coming from the facet joint.   Therapy. A facet joint block may be done to relieve neck or back pain caused by a facet joint. A facet joint block is only done as a therapy if the pain does not improve with medicine, exercise programs, physical therapy, and other forms of pain management. LET YOUR HEALTH CARE PROVIDER KNOW ABOUT:   Any allergies you have.   All medicines you are taking, including vitamins, herbs, eyedrops, and over-the-counter medicines and creams.   Previous problems you or members of your family have had with the use of anesthetics.   Any blood disorders you have had.   Other health problems you have. RISKS AND COMPLICATIONS Generally, having a facet joint block is safe. However, as with any procedure, complications can occur. Possible complications associated with having a facet joint block include:   Bleeding.   Injury to a nerve near the injection site.   Pain at the injection site.   Weakness or numbness in areas controlled by nerves near the injection site.   Infection.   Temporary fluid retention.   Allergic reaction to anesthetics or medicines used during  the procedure. BEFORE THE PROCEDURE   Follow your health care provider's instructions if you are taking dietary supplements or medicines. You may need to stop taking them or reduce your dosage.   Do not take any new dietary supplements or medicines without asking your health care provider first.   Follow your health care provider's instructions about eating and drinking before the procedure. You may need to stop eating and drinking several hours before the procedure.   Arrange to have an adult drive you home after the procedure. PROCEDURE  You may need to remove your clothing and dress in an open-back gown so that your health care provider can access your spine.   The procedure will be done while you are lying on an X-ray table. Most of the time you will be asked to lie on your stomach, but you may be asked to lie in a different position if an injection will be made in your neck.   Special machines will be used to monitor your oxygen levels, heart rate, and blood pressure.   If an injection will be made in your neck, an intravenous (IV) tube will be inserted into one of your veins. Fluids and medicine will flow directly into your body through the IV tube.   The area over the facet joint where the injection will be made will be cleaned with an antiseptic soap. The surrounding skin will be covered with sterile drapes.   An anesthetic will be applied to your skin   to make the injection area numb. You may feel a temporary stinging or burning sensation.   A video X-ray machine will be used to locate the joint. A contrast dye may be injected into the facet joint area to help with locating the joint.   When the joint is located, an anesthetic medicine will be injected into the joint through the needle.   Your health care provider will ask you whether you feel pain relief. If you do feel relief, a steroid may be injected to provide pain relief for a longer period of time. If you do not  feel relief or feel only partial relief, additional injections of an anesthetic may be made in other facet joints.   The needle will be removed, the skin will be cleansed, and bandages will be applied.  AFTER THE PROCEDURE   You will be observed for 15-30 minutes before being allowed to go home. Do not drive. Have an adult drive you or take a taxi or public transportation instead.   If you feel pain relief, the pain will return in several hours or days when the anesthetic wears off.   You may feel pain relief 2-14 days after the procedure. The amount of time this relief lasts varies from person to person.   It is normal to feel some tenderness over the injected area(s) for 2 days following the procedure.   If you have diabetes, you may have a temporary increase in blood sugar.   This information is not intended to replace advice given to you by your health care provider. Make sure you discuss any questions you have with your health care provider.   Document Released: 10/16/2006 Document Revised: 06/17/2014 Document Reviewed: 03/16/2012 Elsevier Interactive Patient Education 2016 [employer] Pain Management Discharge Instructions  General Discharge Instructions :  If you need to reach your doctor call: Monday-Friday 8:00 am - 4:00 pm at [phone] or toll free [phone].  After clinic hours [phone] to have operator reach doctor.  Bring all of your medication bottles to all your appointments in the pain clinic.  To cancel or reschedule your appointment with Pain Management please remember to call 24 hours in advance to avoid a fee.  Refer to the educational materials which you have been given on: General Risks, I had my Procedure. Discharge Instructions, Post Sedation.  Post Procedure Instructions:  The drugs you were given will stay in your system until tomorrow, so for the next 24 hours you should not drive, make any legal decisions or drink any alcoholic  beverages.  You may eat anything you prefer, but it is better to start with liquids then soups and crackers, and gradually work up to solid foods.  Please notify your doctor immediately if you have any unusual bleeding, trouble breathing or pain that is not related to your normal pain.  Depending on the type of procedure that was done, some parts of your body may feel week and/or numb.  This usually clears up by tonight or the next day.  Walk with the use of an assistive device or accompanied by an adult for the 24 hours.  You may use ice on the affected area for the first 24 hours.  Put ice in a Ziploc bag and cover with a towel and place against area 15 minutes on 15 minutes off.  You may switch to heat after 24 hours. 

## 2015-05-16 NOTE — Progress Notes (Signed)
Safety precautions to be maintained throughout the outpatient stay will include: orient to surroundings, keep bed in low position, maintain call bell within reach at all times, provide assistance with transfer out of bed and ambulation.  

## 2015-05-18 NOTE — Progress Notes (Signed)
PROCEDURE PERFORMED: Left side lumbar facet injection at L2-3 L3-4 or L4-5 and L5-S1 under fluoroscopic guidance with moderate sedation.  CC:  Left lower extremity pain with radiation into the left thigh and calf  HPI:  Douglas Parsons presents for reevaluation today. He still has some persistent left-sided low back pain. His left leg pain has improved significantly as compared to his baseline and he referred verbal. He would like to discuss the left side persistent low back pain. Primarily worse with prolonged standing and certain types were occasional motion. Otherwise no change in lower extremity strength or function or bowel bladder function is noted.   Physical Exam:    PERRL, EOMI  Heart RRR   LCTA  Musculoskeletal: He has some paraspinous muscle tenderness but no overt trigger points. He has some pain on extension with left lateral rotation and extension in the standing position this does reproduce pain radiating into the left hip and buttock and down the left posterior lateral leg. This is not present with right lateral rotation. Assessment:  1. Left side facet arthropathy with history of degenerative disc disease that has improved  2. Sciatica affecting the L4 radicular region of the left leg that is improving  3 myofascial low back pain  PLAN:   1. We will proceed with a left-sided facet injection today. The risks and benefits of an reviewed all questions answered. No guarantees were made.  2.  I want him to continue with back exercises with core strengthening  3. We'll have him return to clinic approximately 6 weeks for reevaluation possible repeat injection if indicated at that time.   Patient was taken to the fluoroscopy suite and placed in prone position. A total dose of 5 mg of Versed with 0 cc of fentanyl were titrated for moderate sedation. Vital signs are stable throughout the procedure. The area overlying the aforementioned facets were prepped with Betadine 3 and strict  aseptic technique was utilized throughout the procedure. Identified the areas overlying the after mentioned facets at L2-3 L3-4 or L4-5 L5-S1. 1% lidocaine 1 cc was infiltrated subcutaneous and into the fascia with a 25-gauge needle at each of these sites. I then advanced a 22-gauge 3-1/2 inch Quinckie needle with the needle tip to lie at the [facility] portion of the [employer]"" Scotty dog". There was negative aspiration for heme or CSF and  no paresthesia. I then injected 2 cc of ropivacaine 0.2% mixed with 8 mg of triamcinolone at each of the aforementioned sites. These needles were withdrawn and the L5-S1 needle was redirected towards the left S1 posterior foramen. This was once again no paresthesia, negative aspiration and 2 cc of this same mixture was injected at this site. The patient was convalesced discharged home stable condition for follow-up as mentioned.  Dr. Gwenyth BenderJames Shraga Custard

## 2015-06-13 ENCOUNTER — Ambulatory Visit: Payer: Federal, State, Local not specified - PPO | Attending: Anesthesiology | Admitting: Anesthesiology

## 2015-06-13 ENCOUNTER — Encounter: Payer: Self-pay | Admitting: Anesthesiology

## 2015-06-13 VITALS — BP 161/91 | HR 72 | Temp 97.7°F | Resp 18 | Ht 66.0 in | Wt 218.0 lb

## 2015-06-13 DIAGNOSIS — M5430 Sciatica, unspecified side: Secondary | ICD-10-CM

## 2015-06-13 DIAGNOSIS — M5136 Other intervertebral disc degeneration, lumbar region: Secondary | ICD-10-CM | POA: Diagnosis not present

## 2015-06-13 DIAGNOSIS — M79605 Pain in left leg: Secondary | ICD-10-CM | POA: Insufficient documentation

## 2015-06-13 DIAGNOSIS — M1288 Other specific arthropathies, not elsewhere classified, other specified site: Secondary | ICD-10-CM | POA: Diagnosis not present

## 2015-06-13 DIAGNOSIS — M545 Low back pain: Secondary | ICD-10-CM | POA: Insufficient documentation

## 2015-06-13 DIAGNOSIS — M47817 Spondylosis without myelopathy or radiculopathy, lumbosacral region: Secondary | ICD-10-CM | POA: Diagnosis not present

## 2015-06-13 MED ORDER — TRIAMCINOLONE ACETONIDE 40 MG/ML IJ SUSP
INTRAMUSCULAR | Status: AC
  Administered 2015-06-13: 16:00:00
  Filled 2015-06-13: qty 1

## 2015-06-13 MED ORDER — MIDAZOLAM HCL 5 MG/5ML IJ SOLN
INTRAMUSCULAR | Status: AC
  Administered 2015-06-13: 5 mg via INTRAVENOUS
  Filled 2015-06-13: qty 5

## 2015-06-13 MED ORDER — FENTANYL CITRATE (PF) 100 MCG/2ML IJ SOLN
INTRAMUSCULAR | Status: AC
  Filled 2015-06-13: qty 2

## 2015-06-13 MED ORDER — ROPIVACAINE HCL 2 MG/ML IJ SOLN
INTRAMUSCULAR | Status: AC
  Administered 2015-06-13: 16:00:00
  Filled 2015-06-13: qty 10

## 2015-06-13 NOTE — Patient Instructions (Signed)

## 2015-06-13 NOTE — Progress Notes (Signed)
PROCEDURE PERFORMED: Left side lumbar facet injection at L2-3 L3-4 or L4-5 and L5-S1 and S1 under fluoroscopic guidance with moderate sedation.  CC:  Left lower extremity pain with radiation into the left thigh and calf  HPI: Douglas Parsons presents for reevaluation today last seen last month at which point he had his first facet block. He states that he's done quite well with this and feels that he's had 95% improvement in his left hip left anterior thigh pain. This is been the most dramatic improvement he seen and he desires to proceed with a repeat injection today. This symptom quality characteristic and distribution otherwise are stable in nature. He is trying to work on weight loss and core strengthening.   Physical Exam:    PERRL, EOMI  Heart RRR   LCTA  Musculoskeletal: He has some paraspinous muscle tenderness but no overt trigger points. He has some pain on extension with left lateral rotation and extension in the standing position this does reproduce pain radiating into the left hip and buttock and down the left posterior lateral leg but this is much better than in the past.. This is not present with right lateral rotation. Assessment:  1. Left side facet arthropathy with history of degenerative disc disease that has improved significantly following his last facet injection.  2. Sciatica affecting the L4 radicular region of the left leg that is improving  3 myofascial low back pain  PLAN:   1. We will proceed with a left-sided facet injection today. The risks and benefits of an reviewed all questions answered. No guarantees were made. Return to clinic in 2 months for reevaluation. We have also discussed options with the potential radiofrequency ablation in the future if indicated.  2.  I want him to continue with back exercises with core strengthening     Patient was taken to the fluoroscopy suite and placed in prone position. A total dose of 5 mg of Versed with 0 cc of fentanyl were  titrated for moderate sedation. Vital signs are stable throughout the procedure. The area overlying the aforementioned facets were prepped with Betadine 3 and strict aseptic technique was utilized throughout the procedure. Identified the areas overlying the after mentioned facets at L2-3 L3-4 or L4-5 L5-S1. 1% lidocaine 1 cc was infiltrated subcutaneous and into the fascia with a 25-gauge needle at each of these sites. I then advanced a 22-gauge 3-1/2 inch Quinckie needle with the needle tip to lie at the [facility] portion of the [employer]"" Scotty dog". There was negative aspiration for heme or CSF and  no paresthesia. I then injected 2 cc of ropivacaine 0.2% mixed with 8 mg of triamcinolone at each of the aforementioned sites. These needles were withdrawn and the L5-S1 needle was redirected towards the left S1 posterior foramen. This was once again no paresthesia, negative aspiration and 2 cc of this same mixture was injected at this site. The patient was convalesced discharged home stable condition for follow-up as mentioned.  Dr. Gwenyth BenderJames Adams

## 2015-06-13 NOTE — Progress Notes (Signed)
Safety precautions to be maintained throughout the outpatient stay will include: orient to surroundings, keep bed in low position, maintain call bell within reach at all times, provide assistance with transfer out of bed and ambulation.  

## 2015-06-13 NOTE — Progress Notes (Signed)
   Subjective:    Patient ID: Lawanda CousinsMark P Yeagle, male    DOB: 12/17/1965, 50 y.o.   MRN: [id number]  HPI    Review of Systems     Objective:   Physical Exam        Assessment & Plan:

## 2015-06-14 ENCOUNTER — Ambulatory Visit: Payer: Federal, State, Local not specified - PPO | Admitting: Anesthesiology

## 2015-06-14 ENCOUNTER — Telehealth: Payer: Self-pay | Admitting: *Deleted

## 2015-06-14 NOTE — Telephone Encounter (Signed)
Denies complications post procedure. 

## 2015-08-21 ENCOUNTER — Encounter: Payer: Self-pay | Admitting: Anesthesiology

## 2015-08-21 ENCOUNTER — Ambulatory Visit: Payer: Federal, State, Local not specified - PPO | Attending: Anesthesiology | Admitting: Anesthesiology

## 2015-08-21 VITALS — BP 131/96 | HR 101 | Temp 98.2°F | Resp 16 | Ht 66.0 in | Wt 225.0 lb

## 2015-08-21 DIAGNOSIS — M545 Low back pain: Secondary | ICD-10-CM | POA: Diagnosis not present

## 2015-08-21 DIAGNOSIS — M79605 Pain in left leg: Secondary | ICD-10-CM | POA: Insufficient documentation

## 2015-08-21 DIAGNOSIS — M5430 Sciatica, unspecified side: Secondary | ICD-10-CM | POA: Diagnosis not present

## 2015-08-21 DIAGNOSIS — M5432 Sciatica, left side: Secondary | ICD-10-CM | POA: Diagnosis not present

## 2015-08-21 DIAGNOSIS — M4690 Unspecified inflammatory spondylopathy, site unspecified: Secondary | ICD-10-CM | POA: Insufficient documentation

## 2015-08-21 DIAGNOSIS — M5136 Other intervertebral disc degeneration, lumbar region: Secondary | ICD-10-CM

## 2015-08-21 DIAGNOSIS — M791 Myalgia: Secondary | ICD-10-CM | POA: Insufficient documentation

## 2015-08-21 DIAGNOSIS — M1288 Other specific arthropathies, not elsewhere classified, other specified site: Secondary | ICD-10-CM | POA: Insufficient documentation

## 2015-08-21 DIAGNOSIS — M47817 Spondylosis without myelopathy or radiculopathy, lumbosacral region: Secondary | ICD-10-CM

## 2015-08-21 DIAGNOSIS — M5386 Other specified dorsopathies, lumbar region: Secondary | ICD-10-CM

## 2015-08-21 MED ORDER — CELECOXIB 200 MG PO CAPS: 200.0000 mg | ORAL_CAPSULE | Freq: Every day | ORAL | Status: DC

## 2015-08-21 MED ORDER — CYCLOBENZAPRINE HCL 10 MG PO TABS: 10.0000 mg | ORAL_TABLET | Freq: Three times a day (TID) | ORAL | Status: DC | PRN

## 2015-08-21 NOTE — Patient Instructions (Signed)
Facet Blocks Patient Information  Description: The facets are joints in the spine between the vertebrae.  Like any joints in the body, facets can become irritated and painful.  Arthritis can also effect the facets.  By injecting steroids and local anesthetic in and around these joints, we can temporarily block the nerve supply to them.  Steroids act directly on irritated nerves and tissues to reduce selling and inflammation which often leads to decreased pain.  Facet blocks may be done anywhere along the spine from the neck to the low back depending upon the location of your pain.   After numbing the skin with local anesthetic (like Novocaine), a small needle is passed onto the facet joints under x-ray guidance.  You may experience a sensation of pressure while this is being done.  The entire block usually lasts about 15-25 minutes.   Conditions which may be treated by facet blocks:   Low back/buttock pain  Neck/shoulder pain  Certain types of headaches  Preparation for the injection:  1. Do not eat any solid food or dairy products within 8 hours of your appointment. 2. You may drink clear liquid up to 3 hours before appointment.  Clear liquids include water, black coffee, juice or soda.  No milk or cream please. 3. You may take your regular medication, including pain medications, with a sip of water before your appointment.  Diabetics should hold regular insulin (if taken separately) and take 1/2 normal NPH dose the morning of the procedure.  Carry some sugar containing items with you to your appointment. 4. A driver must accompany you and be prepared to drive you home after your procedure. 5. Bring all your current medications with you. 6. An IV may be inserted and sedation may be given at the discretion of the physician. 7. A blood pressure cuff, EKG and other monitors will often be applied during the procedure.  Some patients may need to have extra oxygen administered for a short  period. 8. You will be asked to provide medical information, including your allergies and medications, prior to the procedure.  We must know immediately if you are taking blood thinners (like Coumadin/Warfarin) or if you are allergic to IV iodine contrast (dye).  We must know if you could possible be pregnant.  Possible side-effects:   Bleeding from needle site  Infection (rare, may require surgery)  Nerve injury (rare)  Numbness & tingling (temporary)  Difficulty urinating (rare, temporary)  Spinal headache (a headache worse with upright posture)  Light-headedness (temporary)  Pain at injection site (serveral days)  Decreased blood pressure (rare, temporary)  Weakness in arm/leg (temporary)  Pressure sensation in back/neck (temporary)   Call if you experience:   Fever/chills associated with headache or increased back/neck pain  Headache worsened by an upright position  New onset, weakness or numbness of an extremity below the injection site  Hives or difficulty breathing (go to the emergency room)  Inflammation or drainage at the injection site(s)  Severe back/neck pain greater than usual  New symptoms which are concerning to you  Please note:  Although the local anesthetic injected can often make your back or neck feel good for several hours after the injection, the pain will likely return. It takes 3-7 days for steroids to work.  You may not notice any pain relief for at least one week.  If effective, we will often do a series of 2-3 injections spaced 3-6 weeks apart to maximally decrease your pain.  After the initial   series, you may be a candidate for a more permanent nerve block of the facets.  If you have any questions, please call [phone] [facility]  Trigger Point Injections Patient Information  Description: Trigger points are areas of muscle sensitive to touch which cause pain with movement, sometimes felt some  distance from the site of palpation.  Usually the muscle containing these trigger points if felt as a tight band or knot.   The area of maximum tenderness or trigger point is identified, and after antiseptic preparation of the skin, a small needle is placed into this site.  Reproduction of the pain often occurs and numbing medicine (local anesthetic) is injected into the site, sometimes along with steroid preparation.  The entire block usually lasts less than 5 minutes.  Conditions which may be treated by trigger points:   Muscular pain and spasm  Nerve irritation  Preparation for the injection:  9. Do not eat any solid food or dairy products within 8 hours of your appointment. 10. You may drink clear liquids up to 3 hours before appointment.  Clear liquids include water, black coffee, juice or soda.  No milk or cream please. 11. You may take your regular medications, including pain medications, with a sip of water before your appointment.  Diabetics should hold regular insulin ( if take separately) and take 1/2 normal NPH dose the morning of the procedure.  Carry some sugar containing items with you to your appointment. 12. A driver must accompany you and be prepared to drive you home after your procedure.  13. Bring all your current medications with you. 14. An IV may be inserted and sedation may be given at the discretion of the physician.  15. A blood pressure cuff, EKG, and other monitors will often be applied during the procedure.  Some patients may need to have extra oxygen administered for a short period. 16. You will be asked to provide medical information, including your allergies and medications, prior to the procedure.  We must know immediately if you are taking blood thinners (like Coumadin/Warfarin) or if you are allergic to IV iodine contrast (dye).  We must know if you could possibly be pregnant.  Possible side-effects:   Bleeding from needle site  Infection (rare, may  require surgery)  Nerve injury (rare)  Numbness & tingling (temporary)  Punctured lung (if injection around chest)  Light-headedness (temporary)  Pain at injection site (several days)  Decreased blood pressure (rare, temporary)  Weakness in arm/leg (temporary)  Call if you experience:   Hive or difficulty breathing (go to the emergency room)  Inflammation or drainage at the injection site(s)  Please note:  Although the local anesthetic injected can often make your painful muscle feel good for several hours after the injection, the pain may return.  It takes 3-7 days for steroids to work.  You may not notice any pain relief for at least one week.  If effective, we will often do a series of injections spaced 3-6 weeks apart to maximally decrease your pain.  If you have any questions please call [phone] [facility]

## 2015-08-22 NOTE — Progress Notes (Signed)
PROCEDURE PERFORMED: None  CC:  Left lower extremity pain with radiation into the left thigh and calf  HPI: Douglas Parsons presents for reevaluation last seen 2 months ago. His been through a series of epidural steroids and facet block at his last visit he reports that he failed to gain any significant improvement with his last injection. He has an area in the left lower back gluteal region that has been quite troublesome for him. He said spasming in this region and pain that is worse with prolonged standing and activity. In the past he did receive significant improvement is 0 lumbar facet block but failed to gain any significant improvement with this last injection. He denies any problems with large strength function is a bowel or bladder function.   Physical Exam:    PERRL, EOMI  Heart RRR   LCTA  Musculoskeletal: He has significant pain in the left lower gluteal region and compression in this area does radiate pain into the left posterior lateral leg and distribution been experiencing pain. He walks with a mildly antalgic gait but otherwise no change on examination today.  Assessment:  1. Left side facet arthropathy with history of degenerative disc disease that has improved significantly following his last facet injection.  2. 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   Douglas Parsons

## 2015-09-12 ENCOUNTER — Encounter: Payer: Self-pay | Admitting: Anesthesiology

## 2015-09-12 ENCOUNTER — Ambulatory Visit: Payer: Federal, State, Local not specified - PPO | Attending: Anesthesiology | Admitting: Anesthesiology

## 2015-09-12 VITALS — BP 127/60 | HR 69 | Temp 98.0°F | Resp 12 | Ht 64.0 in | Wt 219.0 lb

## 2015-09-12 DIAGNOSIS — M5430 Sciatica, unspecified side: Secondary | ICD-10-CM

## 2015-09-12 DIAGNOSIS — M5416 Radiculopathy, lumbar region: Secondary | ICD-10-CM | POA: Diagnosis not present

## 2015-09-12 DIAGNOSIS — M5136 Other intervertebral disc degeneration, lumbar region: Secondary | ICD-10-CM

## 2015-09-12 DIAGNOSIS — M47817 Spondylosis without myelopathy or radiculopathy, lumbosacral region: Secondary | ICD-10-CM | POA: Diagnosis not present

## 2015-09-12 DIAGNOSIS — M545 Low back pain: Secondary | ICD-10-CM

## 2015-09-12 DIAGNOSIS — M5386 Other specified dorsopathies, lumbar region: Secondary | ICD-10-CM

## 2015-09-12 DIAGNOSIS — M79605 Pain in left leg: Secondary | ICD-10-CM | POA: Diagnosis present

## 2015-09-12 MED ORDER — ROPIVACAINE HCL 2 MG/ML IJ SOLN: 10.0000 mL | Freq: Once | INTRAMUSCULAR | Status: AC

## 2015-09-12 MED ORDER — TRIAMCINOLONE ACETONIDE 40 MG/ML IJ SUSP: 40.0000 mg | Freq: Once | INTRAMUSCULAR | Status: AC

## 2015-09-12 MED ORDER — DEXAMETHASONE SODIUM PHOSPHATE 10 MG/ML IJ SOLN
INTRAMUSCULAR | Status: AC
  Administered 2015-09-12: 16:00:00
  Filled 2015-09-12: qty 1

## 2015-09-12 MED ORDER — TRIAMCINOLONE ACETONIDE 40 MG/ML IJ SUSP
INTRAMUSCULAR | Status: AC
  Administered 2015-09-12: 16:00:00
  Filled 2015-09-12: qty 1

## 2015-09-12 MED ORDER — LACTATED RINGERS IV SOLN: 1000.0000 mL | INTRAVENOUS | Status: AC

## 2015-09-12 MED ORDER — FENTANYL CITRATE (PF) 100 MCG/2ML IJ SOLN
INTRAMUSCULAR | Status: AC
  Filled 2015-09-12: qty 2

## 2015-09-12 MED ORDER — SODIUM CHLORIDE 0.9% FLUSH: 10.0000 mL | Freq: Once | INTRAVENOUS | Status: AC

## 2015-09-12 MED ORDER — IOPAMIDOL (ISOVUE-M 200) INJECTION 41%
INTRAMUSCULAR | Status: AC
  Administered 2015-09-12: 16:00:00
  Filled 2015-09-12: qty 10

## 2015-09-12 MED ORDER — IOPAMIDOL (ISOVUE-M 200) INJECTION 41%: 20.0000 mL | Freq: Once | INTRAMUSCULAR | Status: AC | PRN

## 2015-09-12 MED ORDER — DEXAMETHASONE SODIUM PHOSPHATE 4 MG/ML IJ SOLN: 4.0000 mg | Freq: Once | INTRAMUSCULAR | Status: AC

## 2015-09-12 MED ORDER — ROPIVACAINE HCL 2 MG/ML IJ SOLN
INTRAMUSCULAR | Status: AC
  Administered 2015-09-12: 16:00:00
  Filled 2015-09-12: qty 10

## 2015-09-12 MED ORDER — LIDOCAINE HCL (PF) 1 % IJ SOLN: 5.0000 mL | Freq: Once | INTRAMUSCULAR | Status: AC

## 2015-09-12 MED ORDER — LIDOCAINE HCL (PF) 1 % IJ SOLN
INTRAMUSCULAR | Status: AC
Start: 2015-09-12 — End: 2015-09-12
  Administered 2015-09-12: 16:00:00
  Filled 2015-09-12: qty 5

## 2015-09-12 MED ORDER — MIDAZOLAM HCL 2 MG/2ML IJ SOLN: 5.0000 mg | Freq: Once | INTRAMUSCULAR | Status: AC

## 2015-09-12 MED ORDER — MIDAZOLAM HCL 5 MG/5ML IJ SOLN
INTRAMUSCULAR | Status: AC
  Administered 2015-09-12: 4 mg via SURGICAL_CAVITY
  Filled 2015-09-12: qty 5

## 2015-09-12 MED ORDER — SODIUM CHLORIDE 0.9 % IJ SOLN
INTRAMUSCULAR | Status: AC
  Administered 2015-09-12: 16:00:00
  Filled 2015-09-12: qty 10

## 2015-09-12 MED ORDER — METHOCARBAMOL 750 MG PO TABS: 750.0000 mg | ORAL_TABLET | Freq: Three times a day (TID) | ORAL | Status: DC | PRN

## 2015-09-12 NOTE — Progress Notes (Signed)
Safety precautions to be maintained throughout the outpatient stay will include: orient to surroundings, keep bed in low position, maintain call bell within reach at all times, provide assistance with transfer out of bed and ambulation.  

## 2015-09-12 NOTE — Progress Notes (Addendum)
PROCEDURE PERFORMED: L4-5 epidural steroid under fluoroscopic guidance with moderate sedation  CC:  Left lower extremity pain with radiation into the left thigh and calf  HPI: Douglas Parsons was last seen approximately 3 weeks ago. He was seen secondary to left lower back spasming and some periodic pain radiating into the left anterior thigh and lower leg. He has had a series of facet injections 2 and gained moderate improvement with these but still has radiating pain into the left foot with associated intermittent numbness and tingling. He also has spasming in the left lower back overlying the left posterior hip. No other changes are noted but the pain has been recalcitrant despite therapeutic intervention and medication management. He continues to do some conservative stretching strengthening with mild improvement.  Physical Exam:    PERRL, EOMI  Heart RRR   LCTA  Musculoskeletal: He has a positive straight leg raise on the left side negative on the right with a trigger point noted overlying the superior inferior iliac crest on the left posteriorly. Strength appears to be well-preserved  Assessment:  1. L4 left-sided radiculitis  2. Left greater than right facet genic pain  3 myofascial low back pain  PLAN:   1: We will proceed with a L4-L5 lumbar epidural steroid as discussed with him today. All the risks and benefits have been reviewed. 2: We'll proceed with a trigger point injection as well to the left lower back 3: Continue back stretching strengthening exercises 4: We'll refer to Dr. Tressie StalkerJeffrey Jenkins for neurosurgical consultation and evaluation. He may ultimately require a repeat MRI   Procedure: L4-L5 LESI with fluoroscopic guidance and moderate sedation  NOTE: The risks, benefits, and expectations of the procedure have been discussed and explained to the patient who was understanding and in agreement with suggested treatment plan. No guarantees were made.  DESCRIPTION OF PROCEDURE:  Lumbar epidural steroid injection with IV Versed, EKG, blood pressure, pulse, and pulse oximetry monitoring. The procedure was performed with the patient in the prone position under fluoroscopic guidance. A local anesthetic skin wheal of 1.5% plain lidocaine was performed at the appropriate site after fluoroscopic identifictation  Using strict aseptic technique, I then advanced an 18-gauge Tuohy epidural needle in the midline via loss-of-resistance to saline. There was negative aspiration for heme or  CSF.  I then confirmed position with both AP and Lateral fluoroscan. At L4-L5  A total of 5 mL of Preservative-Free normal saline with 40 mg of Kenalog and 1cc Ropicaine 0.2 percent was injected incrementally via the  epidurally placed needle. Needle removed. The patient tolerated the injection well and was convalesced and discharged to home in stable condition. If the patient has any post procedure difficulty they have been instructed on how to contact us for assistance.   He also performed a trigger point injection overlying the left posterior superior iliac crest with 8 cc of ropivacaine 0.2% and 10 mg of Decadron with a 25-gauge needle after Betadine prep and negative aspiration and this was tolerated without difficulty.     Dr. Gwenyth BenderJames Keilin Gamboa

## 2015-09-12 NOTE — Patient Instructions (Signed)
Epidural Steroid Injection An epidural steroid injection is given to relieve pain in your neck, back, or legs that is caused by the irritation or swelling of a nerve root. This procedure involves injecting a steroid and numbing medicine (anesthetic) into the epidural space. The epidural space is the space between the outer covering of your spinal cord and the bones that form your backbone (vertebra).  LET YOUR HEALTH CARE PROVIDER KNOW ABOUT:   Any allergies you have.  All medicines you are taking, including vitamins, herbs, eye drops, creams, and over-the-counter medicines such as aspirin.  Previous problems you or members of your family have had with the use of anesthetics.  Any blood disorders or blood clotting disorders you have.  Previous surgeries you have had.  Medical conditions you have. RISKS AND COMPLICATIONS Generally, this is a safe procedure. However, as with any procedure, complications can occur. Possible complications of epidural steroid injection include:  Headache.  Bleeding.  Infection.  Allergic reaction to the medicines.  Damage to your nerves. The response to this procedure depends on the underlying cause of the pain and its duration. People who have long-term (chronic) pain are less likely to benefit from epidural steroids than are those people whose pain comes on strong and suddenly. BEFORE THE PROCEDURE   Ask your health care provider about changing or stopping your regular medicines. You may be advised to stop taking blood-thinning medicines a few days before the procedure.  You may be given medicines to reduce anxiety.  Arrange for someone to take you home after the procedure. PROCEDURE   You will remain awake during the procedure. You may receive medicine to make you relaxed.  You will be asked to lie on your stomach.  The injection site will be cleaned.  The injection site will be numbed with a medicine (local anesthetic).  A needle will be  injected through your skin into the epidural space.  Your health care provider will use an X-ray machine to ensure that the steroid is delivered closest to the affected nerve. You may have minimal discomfort at this time.  Once the needle is in the right position, the local anesthetic and the steroid will be injected into the epidural space.  The needle will then be removed and a bandage will be applied to the injection site. AFTER THE PROCEDURE   You may be monitored for a short time before you go home.  You may feel weakness or numbness in your arm or leg, which disappears within hours.  You may be allowed to eat, drink, and take your regular medicine.  You may have soreness at the site of the injection.   This information is not intended to replace advice given to you by your health care provider. Make sure you discuss any questions you have with your health care provider.   Document Released: 09/03/2007 Document Revised: 01/27/2013 Document Reviewed: 11/13/2012 Elsevier Interactive Patient Education 2016 [employer] Pain Management Discharge Instructions  General Discharge Instructions :  If you need to reach your doctor call: Monday-Friday 8:00 am - 4:00 pm at [phone] or toll free [phone].  After clinic hours [phone] to have operator reach doctor.  Bring all of your medication bottles to all your appointments in the pain clinic.  To cancel or reschedule your appointment with Pain Management please remember to call 24 hours in advance to avoid a fee.  Refer to the educational materials which you have been given on: General Risks, I had my Procedure.   Discharge Instructions, Post Sedation.  Post Procedure Instructions:  The drugs you were given will stay in your system until tomorrow, so for the next 24 hours you should not drive, make any legal decisions or drink any alcoholic beverages.  You may eat anything you prefer, but it is better to start with  liquids then soups and crackers, and gradually work up to solid foods.  Please notify your doctor immediately if you have any unusual bleeding, trouble breathing or pain that is not related to your normal pain.  Depending on the type of procedure that was done, some parts of your body may feel week and/or numb.  This usually clears up by tonight or the next day.  Walk with the use of an assistive device or accompanied by an adult for the 24 hours.  You may use ice on the affected area for the first 24 hours.  Put ice in a Ziploc bag and cover with a towel and place against area 15 minutes on 15 minutes off.  You may switch to heat after 24 hours.GENERAL RISKS AND COMPLICATIONS  What are the risk, side effects and possible complications? Generally speaking, most procedures are safe.  However, with any procedure there are risks, side effects, and the possibility of complications.  The risks and complications are dependent upon the sites that are lesioned, or the type of nerve block to be performed.  The closer the procedure is to the spine, the more serious the risks are.  Great care is taken when placing the radio frequency needles, block needles or lesioning probes, but sometimes complications can occur.  Infection: Any time there is an injection through the skin, there is a risk of infection.  This is why sterile conditions are used for these blocks.  There are four possible types of infection.  Localized skin infection.  Central Nervous System Infection-This can be in the form of Meningitis, which can be deadly.  Epidural Infections-This can be in the form of an epidural abscess, which can cause pressure inside of the spine, causing compression of the spinal cord with subsequent paralysis. This would require an emergency surgery to decompress, and there are no guarantees that the patient would recover from the paralysis.  Discitis-This is an infection of the intervertebral discs.  It occurs in  about 1% of discography procedures.  It is difficult to treat and it may lead to surgery.        2. Pain: the needles have to go through skin and soft tissues, will cause soreness.       3. Damage to internal structures:  The nerves to be lesioned may be near blood vessels or    other nerves which can be potentially damaged.       4. Bleeding: Bleeding is more common if the patient is taking blood thinners such as  aspirin, Coumadin, Ticiid, Plavix, etc., or if he/she have some genetic predisposition  such as hemophilia. Bleeding into the spinal canal can cause compression of the spinal  cord with subsequent paralysis.  This would require an emergency surgery to  decompress and there are no guarantees that the patient would recover from the  paralysis.       5. Pneumothorax:  Puncturing of a lung is a possibility, every time a needle is introduced in  the area of the chest or upper back.  Pneumothorax refers to free air around the  collapsed lung(s), inside of the thoracic cavity (chest cavity).  Another two   possible  complications related to a similar event would include: Hemothorax and Chylothorax.   These are variations of the Pneumothorax, where instead of air around the collapsed  lung(s), you may have blood or chyle, respectively.       6. Spinal headaches: They may occur with any procedures in the area of the spine.       7. Persistent CSF (Cerebro-Spinal Fluid) leakage: This is a rare problem, but may occur  with prolonged intrathecal or epidural catheters either due to the formation of a fistulous  track or a dural tear.       8. Nerve damage: By working so close to the spinal cord, there is always a possibility of  nerve damage, which could be as serious as a permanent spinal cord injury with  paralysis.       9. Death:  Although rare, severe deadly allergic reactions known as "Anaphylactic  reaction" can occur to any of the medications used.      10. Worsening of the symptoms:  We can always  make thing worse.  What are the chances of something like this happening? Chances of any of this occuring are extremely low.  By statistics, you have more of a chance of getting killed in a motor vehicle accident: while driving to the hospital than any of the above occurring .  Nevertheless, you should be aware that they are possibilities.  In general, it is similar to taking a shower.  Everybody knows that you can slip, hit your head and get killed.  Does that mean that you should not shower again?  Nevertheless always keep in mind that statistics do not mean anything if you happen to be on the wrong side of them.  Even if a procedure has a 1 (one) in a 1,000,000 (million) chance of going wrong, it you happen to be that one..Also, keep in mind that by statistics, you have more of a chance of having something go wrong when taking medications.  Who should not have this procedure? If you are on a blood thinning medication (e.g. Coumadin, Plavix, see list of "Blood Thinners"), or if you have an active infection going on, you should not have the procedure.  If you are taking any blood thinners, please inform your physician.  How should I prepare for this procedure?  Do not eat or drink anything at least six hours prior to the procedure.  Bring a driver with you .  It cannot be a taxi.  Come accompanied by an adult that can drive you back, and that is strong enough to help you if your legs get weak or numb from the local anesthetic.  Take all of your medicines the morning of the procedure with just enough water to swallow them.  If you have diabetes, make sure that you are scheduled to have your procedure done first thing in the morning, whenever possible.  If you have diabetes, take only half of your insulin dose and notify our nurse that you have done so as soon as you arrive at the clinic.  If you are diabetic, but only take blood sugar pills (oral hypoglycemic), then do not take them on the  morning of your procedure.  You may take them after you have had the procedure.  Do not take aspirin or any aspirin-containing medications, at least eleven (11) days prior to the procedure.  They may prolong bleeding.  Wear loose fitting clothing that may be easy to take off   and that you would not mind if it got stained with Betadine or blood.  Do not wear any jewelry or perfume  Remove any nail coloring.  It will interfere with some of our monitoring equipment.  NOTE: Remember that this is not meant to be interpreted as a complete list of all possible complications.  Unforeseen problems may occur.  BLOOD THINNERS The following drugs contain aspirin or other products, which can cause increased bleeding during surgery and should not be taken for 2 weeks prior to and 1 week after surgery.  If you should need take something for relief of minor pain, you may take acetaminophen which is found in Tylenol,m Datril, Anacin-3 and Panadol. It is not blood thinner. The products listed below are.  Do not take any of the products listed below in addition to any listed on your instruction sheet.  A.P.C or A.P.C with Codeine Codeine Phosphate Capsules #3 Ibuprofen Ridaura  ABC compound Congesprin Imuran rimadil  Advil Cope Indocin Robaxisal  Alka-Seltzer Effervescent Pain Reliever and Antacid Coricidin or Coricidin-D  Indomethacin Rufen  Alka-Seltzer plus Cold Medicine Cosprin Ketoprofen S-A-C Tablets  Anacin Analgesic Tablets or Capsules Coumadin Korlgesic Salflex  Anacin Extra Strength Analgesic tablets or capsules CP-2 Tablets Lanoril Salicylate  Anaprox Cuprimine Capsules Levenox Salocol  Anexsia-D Dalteparin Magan Salsalate  Anodynos Darvon compound Magnesium Salicylate Sine-off  Ansaid Dasin Capsules Magsal Sodium Salicylate  Anturane Depen Capsules Marnal Soma  APF Arthritis pain formula Dewitt's Pills Measurin Stanback  Argesic Dia-Gesic Meclofenamic Sulfinpyrazone  Arthritis Bayer Timed  Release Aspirin Diclofenac Meclomen Sulindac  Arthritis pain formula Anacin Dicumarol Medipren Supac  Analgesic (Safety coated) Arthralgen Diffunasal Mefanamic Suprofen  Arthritis Strength Bufferin Dihydrocodeine Mepro Compound Suprol  Arthropan liquid Dopirydamole Methcarbomol with Aspirin Synalgos  ASA tablets/Enseals Disalcid Micrainin Tagament  Ascriptin Doan's Midol Talwin  Ascriptin A/D Dolene Mobidin Tanderil  Ascriptin Extra Strength Dolobid Moblgesic Ticlid  Ascriptin with Codeine Doloprin or Doloprin with Codeine Momentum Tolectin  Asperbuf Duoprin Mono-gesic Trendar  Aspergum Duradyne Motrin or Motrin IB Triminicin  Aspirin plain, buffered or enteric coated Durasal Myochrisine Trigesic  Aspirin Suppositories Easprin Nalfon Trillsate  Aspirin with Codeine Ecotrin Regular or Extra Strength Naprosyn Uracel  Atromid-S Efficin Naproxen Ursinus  Auranofin Capsules Elmiron Neocylate Vanquish  Axotal Emagrin Norgesic Verin  Azathioprine Empirin or Empirin with Codeine Normiflo Vitamin E  Azolid Emprazil Nuprin Voltaren  Bayer Aspirin plain, buffered or children's or timed BC Tablets or powders Encaprin Orgaran Warfarin Sodium  Buff-a-Comp Enoxaparin Orudis Zorpin  Buff-a-Comp with Codeine Equegesic Os-Cal-Gesic   Buffaprin Excedrin plain, buffered or Extra Strength Oxalid   Bufferin Arthritis Strength Feldene Oxphenbutazone   Bufferin plain or Extra Strength Feldene Capsules Oxycodone with Aspirin   Bufferin with Codeine Fenoprofen Fenoprofen Pabalate or Pabalate-SF   Buffets II Flogesic Panagesic   Buffinol plain or Extra Strength Florinal or Florinal with Codeine Panwarfarin   Buf-Tabs Flurbiprofen Penicillamine   Butalbital Compound Four-way cold tablets Penicillin   Butazolidin Fragmin Pepto-Bismol   Carbenicillin Geminisyn Percodan   Carna Arthritis Reliever Geopen Persantine   Carprofen Gold's salt Persistin   Chloramphenicol Goody's Phenylbutazone   Chloromycetin  [place] Piroxlcam   Clmetidine heparin Plaquenil   Cllnoril Hyco-pap Ponstel   Clofibrate Hydroxy chloroquine Propoxyphen         Before stopping any of these medications, be sure to consult the physician who ordered them.  Some, such as Coumadin (Warfarin) are ordered to prevent or treat serious conditions such as "deep thrombosis", "pumonary embolisms", and   other heart problems.  The amount of time that you may need off of the medication may also vary with the medication and the reason for which you were taking it.  If you are taking any of these medications, please make sure you notify your pain physician before you undergo any procedures.         Epidural Steroid Injection An epidural steroid injection is given to relieve pain in your neck, back, or legs that is caused by the irritation or swelling of a nerve root. This procedure involves injecting a steroid and numbing medicine (anesthetic) into the epidural space. The epidural space is the space between the outer covering of your spinal cord and the bones that form your backbone (vertebra).  [facility] PROVID[facility] KNOW ABOUT:   Any allergies you have.  All medicines you are taking, including vitamins, herbs, eye drops, creams, and over-the-counter medicines such as aspirin.  Previous problems you or members of your family have had with the use of anesthetics.  Any blood disorders or blood clotting disorders you have.  Previous surgeries you have had.  Medical conditions you have. RISKS AND COMPLICATIONS Generally, this is a safe procedure. However, as with any procedure, complications can occur. Possible complications of epidural steroid injection include:  Headache.  Bleeding.  Infection.  Allergic reaction to the medicines.  Damage to your nerves. The response to this procedure depends on the underlying cause of the pain and its duration. People who have long-term (chronic) pain are less likely to benefit  from epidural steroids than are those people whose pain comes on strong and suddenly. BEFORE THE PROCEDURE   Ask your health care provider about changing or stopping your regular medicines. You may be advised to stop taking blood-thinning medicines a few days before the procedure.  You may be given medicines to reduce anxiety.  Arrange for someone to take you home after the procedure. PROCEDURE   You will remain awake during the procedure. You may receive medicine to make you relaxed.  You will be asked to lie on your stomach.  The injection site will be cleaned.  The injection site will be numbed with a medicine (local anesthetic).  A needle will be injected through your skin into the epidural space.  Your health care provider will use an X-ray machine to ensure that the steroid is delivered closest to the affected nerve. You may have minimal discomfort at this time.  Once the needle is in the right position, the local anesthetic and the steroid will be injected into the epidural space.  The needle will then be removed and a bandage will be applied to the injection site. AFTER THE PROCEDURE   You may be monitored for a short time before you go home.  You may feel weakness or numbness in your arm or leg, which disappears within hours.  You may be allowed to eat, drink, and take your regular medicine.  You may have soreness at the site of the injection.   This information is not intended to replace advice given to you by your health care provider. Make sure you discuss any questions you have with your health care provider.   Document Released: 09/03/2007 Document Revised: 01/27/2013 Document Reviewed: 11/13/2012 Elsevier Interactive Patient Education [employer]

## 2015-09-13 ENCOUNTER — Telehealth: Payer: Self-pay | Admitting: *Deleted

## 2015-09-13 NOTE — Telephone Encounter (Signed)
Voicemail left for patient to call our office if he has questions or concerns re; procedure on yesterday.

## 2015-12-13 ENCOUNTER — Telehealth: Payer: Self-pay | Admitting: *Deleted

## 2015-12-13 NOTE — Telephone Encounter (Signed)
Pt called stating that doctor Pernell Dupredams informed him that when he starts having pain to call and schedule an injection. I explained to the pt that his schedule for August is not out yet. Pt stated that he need an appt before August. Please give the pt a call...thanks

## 2015-12-15 ENCOUNTER — Telehealth: Payer: Self-pay | Admitting: *Deleted

## 2015-12-15 NOTE — Telephone Encounter (Signed)
Spoke with patient to let him know that we would speak to Dr [name] re; scheduling his procedure earlier than August.  Patient verbalizes u/o information.

## 2015-12-19 NOTE — Telephone Encounter (Signed)
Patient called and confirmed appointment for 01/04/16 at 2pm. Pre procedure instructions given.

## 2016-01-04 ENCOUNTER — Encounter: Payer: Self-pay | Admitting: Anesthesiology

## 2016-01-04 ENCOUNTER — Ambulatory Visit: Payer: Federal, State, Local not specified - PPO | Attending: Anesthesiology | Admitting: Anesthesiology

## 2016-01-04 VITALS — BP 132/69 | HR 63 | Temp 95.8°F | Ht 66.0 in | Wt 187.0 lb

## 2016-01-04 DIAGNOSIS — M5416 Radiculopathy, lumbar region: Secondary | ICD-10-CM | POA: Insufficient documentation

## 2016-01-04 DIAGNOSIS — M797 Fibromyalgia: Secondary | ICD-10-CM

## 2016-01-04 DIAGNOSIS — M545 Low back pain: Secondary | ICD-10-CM | POA: Insufficient documentation

## 2016-01-04 DIAGNOSIS — M62830 Muscle spasm of back: Secondary | ICD-10-CM | POA: Insufficient documentation

## 2016-01-04 DIAGNOSIS — M5386 Other specified dorsopathies, lumbar region: Secondary | ICD-10-CM

## 2016-01-04 DIAGNOSIS — R200 Anesthesia of skin: Secondary | ICD-10-CM | POA: Insufficient documentation

## 2016-01-04 DIAGNOSIS — M791 Myalgia: Secondary | ICD-10-CM | POA: Insufficient documentation

## 2016-01-04 DIAGNOSIS — M7918 Myalgia, other site: Secondary | ICD-10-CM

## 2016-01-04 MED ORDER — TRIAMCINOLONE ACETONIDE 40 MG/ML IJ SUSP: 40.0000 mg | Freq: Once | INTRAMUSCULAR | Status: AC

## 2016-01-04 MED ORDER — ROPIVACAINE HCL 2 MG/ML IJ SOLN
INTRAMUSCULAR | Status: AC
  Administered 2016-01-04: 16:00:00
  Filled 2016-01-04: qty 10

## 2016-01-04 MED ORDER — ROPIVACAINE HCL 5 MG/ML IJ SOLN
10.0000 mL | Freq: Once | INTRAMUSCULAR | Status: AC
  Filled 2016-01-04: qty 10

## 2016-01-04 MED ORDER — DEXAMETHASONE SODIUM PHOSPHATE 10 MG/ML IJ SOLN
INTRAMUSCULAR | Status: AC
  Administered 2016-01-04: 16:00:00
  Filled 2016-01-04: qty 1

## 2016-01-04 NOTE — Patient Instructions (Signed)

## 2016-01-05 ENCOUNTER — Telehealth: Payer: Self-pay

## 2016-01-05 NOTE — Progress Notes (Signed)
PROCEDURE PERFORMED: Left gluteal trigger point injection 2  CC:  Low back pain of the centralized nature with associated spasming   HPI: Douglas Parsons presents for reevaluation. He was last seen a few weeks ago and is now status post previous epidural steroid injections and reports no significant lower extremity pain at this time. Furthermore he had a trigger point injection at his last visit and this combination has eliminated his lower leg pain however he's had some recurrence of the centralized low back pain. The quality characteristic and distribution still involve spasming which occasionally radiates into the left buttock and left posterior leg but not to the calf for lower leg. The numbness and tingling that he periodically had before has resolved.    Physical Exam:    PERRL, EOMI  Heart RRR   LCTA  Musculoskeletal: Negative straight leg raise and persistent tenderness in the left gluteal region left side with an associated trigger point that does yield radiation upon palpation into the left buttock   Assessment:  1. L4 left-sided radiculitisThat is improved   2. Left greater than right facet genic pain  3 myofascial low back painLeft side   PLAN:   1: we will proceed with a trigger point injection to the aforementioned area today with the risks and benefits once again reviewed 2: Continue back stretching strengthening exercises as once again reviewed today.   3. We will defer on MRI at this time secondary to the improvement in his low back symptoms. He has no problems with bowel or bladder dysfunction and lower extremity motor function remains intact.   Procedure: I  performed a trigger point injection overlying the left gluteal region with  8 cc of ropivacaine 0.2% and 10 mg of Decadron with a 25-gauge needle after Betadine prep and negative aspiration and this was tolerated without difficulty.     Dr. Gwenyth Bender

## 2016-01-05 NOTE — Telephone Encounter (Signed)
Post procedure phone call.  Patient states he is doing pretty good.  

## 2016-01-12 ENCOUNTER — Other Ambulatory Visit: Payer: Self-pay | Admitting: Physician Assistant

## 2016-01-12 DIAGNOSIS — M5416 Radiculopathy, lumbar region: Secondary | ICD-10-CM

## 2016-01-22 ENCOUNTER — Ambulatory Visit
Admission: RE | Admit: 2016-01-22 | Discharge: 2016-01-22 | Disposition: A | Discharge status: Home / Self Care | Payer: Federal, State, Local not specified - PPO | Source: Ambulatory Visit | Attending: Physician Assistant | Admitting: Physician Assistant

## 2016-01-22 DIAGNOSIS — M5416 Radiculopathy, lumbar region: Secondary | ICD-10-CM

## 2016-03-07 ENCOUNTER — Ambulatory Visit: Payer: Federal, State, Local not specified - PPO | Attending: Specialist

## 2016-03-07 DIAGNOSIS — G4733 Obstructive sleep apnea (adult) (pediatric): Secondary | ICD-10-CM | POA: Insufficient documentation

## 2016-03-07 DIAGNOSIS — R634 Abnormal weight loss: Secondary | ICD-10-CM | POA: Diagnosis present

## 2016-06-28 ENCOUNTER — Telehealth: Payer: Self-pay | Admitting: Anesthesiology

## 2016-06-28 NOTE — Telephone Encounter (Signed)
Patient's wife called stating husband Douglas Parsons is having pain in lower back down left leg and into foot, would like to get trigger point injections, they are leaving to go on a trip on the 30th and would like to see if dr [name] can work him in next week. There are no scheduled appts available but sometimes dr [name] will work a patient in for tpi. Please ask dr [name] and let Olegario Messierkathy know.

## 2016-06-28 NOTE — Telephone Encounter (Signed)
Spoke with patients wife, Douglas Parsons to let her know that we will check on this next week and see if there is anyway we can get Douglas Parsons in to see Dr [name] for TPI.

## 2016-07-01 NOTE — Telephone Encounter (Signed)
Spoke with patients wife to let her know that we can schedule Douglas Parsons an appt for the next available time slot.  Highly unlikely that we could see him prior to the 30th. [name] verbalizes u/o.  Transferred up front for scheduling.

## 2016-07-24 ENCOUNTER — Telehealth: Payer: Self-pay | Admitting: *Deleted

## 2016-07-25 ENCOUNTER — Ambulatory Visit: Payer: Federal, State, Local not specified - PPO | Attending: Anesthesiology | Admitting: Anesthesiology

## 2016-07-25 ENCOUNTER — Encounter: Payer: Self-pay | Admitting: Anesthesiology

## 2016-07-25 VITALS — BP 117/78 | HR 78 | Temp 98.3°F | Resp 18 | Ht 66.0 in | Wt 198.0 lb

## 2016-07-25 DIAGNOSIS — M7918 Myalgia, other site: Secondary | ICD-10-CM

## 2016-07-25 DIAGNOSIS — M791 Myalgia: Secondary | ICD-10-CM | POA: Diagnosis not present

## 2016-07-25 DIAGNOSIS — M545 Low back pain: Secondary | ICD-10-CM | POA: Diagnosis present

## 2016-07-25 MED ORDER — ROPIVACAINE HCL 2 MG/ML IJ SOLN
INTRAMUSCULAR | Status: AC
  Administered 2016-07-25: 14:00:00
  Filled 2016-07-25: qty 20

## 2016-07-25 MED ORDER — DEXAMETHASONE SODIUM PHOSPHATE 4 MG/ML IJ SOLN
INTRAMUSCULAR | Status: AC
  Administered 2016-07-25: 8 mg
  Filled 2016-07-25: qty 2

## 2016-07-25 MED ORDER — DEXAMETHASONE SODIUM PHOSPHATE 4 MG/ML IJ SOLN: 10.0000 mg | Freq: Once | INTRAMUSCULAR | Status: AC

## 2016-07-25 MED ORDER — ROPIVACAINE HCL 2 MG/ML IJ SOLN: 1.0000 mL | Freq: Once | INTRAMUSCULAR | Status: AC

## 2016-07-25 NOTE — Progress Notes (Signed)
Safety precautions to be maintained throughout the outpatient stay will include: orient to surroundings, keep bed in low position, maintain call bell within reach at all times, provide assistance with transfer out of bed and ambulation.  

## 2016-07-25 NOTE — Progress Notes (Signed)
PROCEDURE PERFORMED: Left gluteal trigger point injection 1  Previous procedure: Left gluteal trigger point 2 in summer 2017  CC:  Low back pain of the centralized nature with associated spasming   HPI: Douglas Parsons presents for reevaluation. He was last seen approximately 8 months ago for a similar complaint of left lower back and gluteal spasming. He had a trigger point injection at that time which gave him complete pain relief up until late January. His strength and function in the lower extremities is at baseline without complaint and he he has started having spasming in the left lower back as before and desires to proceed with a trigger point injection for that today. Otherwise he's taking occasional anti-inflammatories but no other medication for pain    Physical Exam:    PERRL, EOMI  Heart RRR   LCTA  Musculoskeletal: Negative straight leg raise and persistent tenderness in the left gluteal region left side with an associated trigger point that does yield radiation upon palpation into the left buttock   Assessment:  1. L5 left-sided trigger point  2. Left greater than right facet genic pain  3 myofascial low back painLeft side   PLAN:   1: we will proceed with a trigger point injection to the aforementioned area today with the risks and benefits once again reviewed 2: Continue back stretching strengthening exercises as once again reviewed today.   3. We will defer on MRI at this time secondary to the improvement in his low back symptoms. He has no problems with bowel or bladder dysfunction and lower extremity motor function remains intact. He is to follow-up with us on a when necessary basis.  Procedure: I  performed a trigger point injection overlying the left gluteal region with  8 cc of ropivacaine 0.2% and 10 mg of Decadron with a 25-gauge needle after Betadine prep and negative aspiration and this was tolerated without difficulty.     Dr. Gwenyth BenderJames Adams

## 2016-07-26 ENCOUNTER — Telehealth: Payer: Self-pay

## 2016-07-26 NOTE — Telephone Encounter (Signed)
Post procedure phone call.  Left message.  

## 2016-07-26 NOTE — Telephone Encounter (Signed)
Patient returned call and states he is doing fine except he has a headache.  Informed patient that the TPI of hip should not have anything to do with the headache, but to notify us if symptoms worsen or go to PCP or ED.

## 2017-04-01 ENCOUNTER — Ambulatory Visit
Admission: RE | Admit: 2017-04-01 | Discharge: 2017-04-01 | Disposition: A | Discharge status: Home / Self Care | Payer: Federal, State, Local not specified - PPO | Source: Ambulatory Visit | Attending: Internal Medicine | Admitting: Internal Medicine

## 2017-04-01 ENCOUNTER — Other Ambulatory Visit: Payer: Self-pay | Admitting: Internal Medicine

## 2017-04-01 DIAGNOSIS — N134 Hydroureter: Secondary | ICD-10-CM | POA: Insufficient documentation

## 2017-04-01 DIAGNOSIS — N132 Hydronephrosis with renal and ureteral calculous obstruction: Secondary | ICD-10-CM | POA: Diagnosis not present

## 2017-04-01 DIAGNOSIS — R10813 Right lower quadrant abdominal tenderness: Secondary | ICD-10-CM | POA: Diagnosis present

## 2017-04-01 DIAGNOSIS — R109 Unspecified abdominal pain: Secondary | ICD-10-CM | POA: Diagnosis present

## 2017-04-04 ENCOUNTER — Ambulatory Visit (INDEPENDENT_AMBULATORY_CARE_PROVIDER_SITE_OTHER): Payer: Federal, State, Local not specified - PPO | Admitting: Urology

## 2017-04-04 ENCOUNTER — Encounter: Payer: Self-pay | Admitting: Urology

## 2017-04-04 VITALS — BP 135/73 | HR 67 | Ht 66.0 in | Wt 212.3 lb

## 2017-04-04 DIAGNOSIS — N200 Calculus of kidney: Secondary | ICD-10-CM

## 2017-04-04 LAB — URINALYSIS, COMPLETE
BILIRUBIN UA: NEGATIVE
Glucose, UA: NEGATIVE
Leukocytes, UA: NEGATIVE
Nitrite, UA: NEGATIVE
PH UA: 5 (ref 5.0–7.5)
Protein, UA: NEGATIVE
RBC UA: NEGATIVE
SPEC GRAV UA: 1.025 (ref 1.005–1.030)
Urobilinogen, Ur: 0.2 mg/dL (ref 0.2–1.0)

## 2017-04-04 NOTE — Progress Notes (Signed)
04/04/2017 8:40 AM   Douglas Parsons 04/22/1966 [id number]  Referring provider: Marguarite ArbourSparks, Jeffrey D, MD [address] [facility] [place], [place] [address]  Chief Complaint  Patient presents with  . Flank Pain    kidney stone on R side    HPI: Douglas Parsons is a 51 year-old male referred for evaluation of renal colic.  He had onset of right flank pain radiating to the right lower quadrant in early October.  His pain was intermittent without identifiable precipitating, aggravating or alleviating factors.  He passed 3 small stones earlier this week however had increasing right lower quadrant abdominal pain and was seen [facility].  A renal stone protocol CT was performed which showed mild right hydronephrosis and hydroureter with a 1-2 mm ureteral calculus at the UVJ.  He passed a stone yesterday and had resolution of his symptoms.  He states he was in a [facility] with a cholesterol lowering agent and that 1 of the side effect was an elevated uric acid.  He denies previous history of stone disease.   PMH: Past Medical History:  Diagnosis Date  . GERD (gastroesophageal reflux disease)   . Hypertension     Surgical History: Past Surgical History:  Procedure Laterality Date  . CARDIAC CATHETERIZATION    . TONSILLECTOMY  1994    Home Medications:  Allergies as of 04/04/2017      Reactions   Atorvastatin Other (See Comments)   Lovastatin Other (See Comments)   Rosuvastatin Other (See Comments)   Simvastatin Other (See Comments)   Statins Other (See Comments)   Muscle aches      Medication List       Accurate as of 04/04/17  8:40 AM. Always use your most recent med list.          aspirin 325 MG tablet Take 325 mg by mouth daily.   co-enzyme Q-10 30 MG capsule Take 30 mg by mouth daily.   cyclobenzaprine 10 MG tablet Commonly known as:  FLEXERIL Take 1 tablet (10 mg total) by mouth 3 (three) times daily as needed  for muscle spasms.   ferrous sulfate 325 (65 FE) MG tablet Take 325 mg by mouth daily with breakfast.   HYDROcodone-acetaminophen 10-325 MG tablet Commonly known as:  NORCO Take 1 tablet by mouth every 6 (six) hours as needed.   magnesium 30 MG tablet Take 30 mg by mouth daily.   metoprolol succinate 50 MG 24 hr tablet Commonly known as:  TOPROL-XL Take 50 mg by mouth daily. Take with or immediately following a meal.   multivitamin capsule Take 1 capsule by mouth daily.   tamsulosin 0.4 MG Caps capsule Commonly known as:  FLOMAX Take 1 capsule by mouth daily.   Vitamin D3 1000 units Caps Take by mouth 3 (three) times daily.       Allergies:  Allergies  Allergen Reactions  . Atorvastatin Other (See Comments)  . Lovastatin Other (See Comments)  . Rosuvastatin Other (See Comments)  . Simvastatin Other (See Comments)  . Statins Other (See Comments)    Muscle aches    Family History: Family History  Problem Relation Age of Onset  . Arthritis Mother   . COPD Mother   . Hyperlipidemia Mother   . Vision loss Mother   . Cancer Father   . Early death Father   . Heart disease Father   . Hyperlipidemia Father   . Hypertension Father  Social History:  reports that he has quit smoking. His smoking use included Cigarettes. He has never used smokeless tobacco. His alcohol and drug histories are not on file.  ROS: UROLOGY Frequent Urination?: Yes Hard to postpone urination?: Yes Burning/pain with urination?: Yes Get up at night to urinate?: Yes Leakage of urine?: No Urine stream starts and stops?: Yes Trouble starting stream?: Yes Do you have to strain to urinate?: Yes Blood in urine?: Yes Urinary tract infection?: No Sexually transmitted disease?: No Injury to kidneys or bladder?: No Painful intercourse?: No Weak stream?: Yes Erection problems?: No Penile pain?: Yes  Gastrointestinal Nausea?: Yes Vomiting?: Yes Indigestion/heartburn?: No Diarrhea?:  Yes Constipation?: No  Constitutional Fever: No Night sweats?: No Weight loss?: No Fatigue?: Yes  Skin Skin rash/lesions?: No Itching?: Yes  Eyes Blurred vision?: No Double vision?: No  Ears/Nose/Throat Sore throat?: No Sinus problems?: No  Hematologic/Lymphatic Swollen glands?: No Easy bruising?: No  Cardiovascular Leg swelling?: No Chest pain?: No  Respiratory Cough?: No Shortness of breath?: Yes  Endocrine Excessive thirst?: No  Musculoskeletal Back pain?: Yes Joint pain?: Yes  Neurological Headaches?: Yes Dizziness?: No  Psychologic Depression?: No Anxiety?: No  Physical Exam: BP 135/73 (BP Location: Right Arm, Patient Position: Sitting, Cuff Size: Normal)   Pulse 67   Ht 5\' 6"  (1.676 m)   Wt 212 lb 4.8 oz (96.3 kg)   BMI 34.27 kg/m    Constitutional:  Alert and oriented, No acute distress. HEENT: [place] AT, moist mucus membranes.  Trachea midline, no masses. Cardiovascular: No clubbing, cyanosis, or edema. Respiratory: Normal respiratory effort, no increased work of breathing. GI: Abdomen is soft, nontender, nondistended, no abdominal masses GU: No CVA tenderness.  Skin: No rashes, bruises or suspicious lesions. Lymph: No cervical or inguinal adenopathy. Neurologic: Grossly intact, no focal deficits, moving all 4 extremities. Psychiatric: Normal mood and affect.  Laboratory Data: Lab Results  Component Value Date   WBC 8.2 03/13/2013   HGB 15.0 03/13/2013   HCT 43.6 03/13/2013   MCV 88 03/13/2013   PLT 240 03/13/2013    Lab Results  Component Value Date   CREATININE 1.01 03/13/2013    Urinalysis Dipstick/microscopic negative  Pertinent Imaging: CT 10/23 was personally reviewed  Assessment & Plan:   1.  Nephrolithiasis No previous history of stone disease and he has passed 4 small stones in the last week.  He was unable to retrieve his passed stones.  Will check a 24-hour urine and order will be sent to [facility].  He was sent  to the lab for basic metabolic panel, calcium and uric acid.  Follow-up 3 months.  - Urinalysis, Complete    Riki Altes, MD  [facility] [address], [address] [place], [place] [address] [phone]

## 2017-04-05 LAB — BASIC METABOLIC PANEL
BUN / CREAT RATIO: 21 — AB (ref 9–20)
BUN: 14 mg/dL (ref 6–24)
CO2: 23 mmol/L (ref 20–29)
Calcium: 9.1 mg/dL (ref 8.7–10.2)
Chloride: 102 mmol/L (ref 96–106)
Creatinine, Ser: 0.67 mg/dL — ABNORMAL LOW (ref 0.76–1.27)
GFR calc non Af Amer: 112 mL/min/{1.73_m2} (ref >59)
GFR, EST AFRICAN AMERICAN: 130 mL/min/{1.73_m2} (ref >59)
GLUCOSE: 95 mg/dL (ref 65–99)
POTASSIUM: 4.5 mmol/L (ref 3.5–5.2)
SODIUM: 140 mmol/L (ref 134–144)

## 2017-04-05 LAB — URIC ACID: URIC ACID: 6.8 mg/dL (ref 3.7–8.6)

## 2017-04-08 ENCOUNTER — Telehealth: Payer: Self-pay | Admitting: Family Medicine

## 2017-04-08 NOTE — Telephone Encounter (Signed)
Patient notified and will 24 hr urine

## 2017-04-08 NOTE — Telephone Encounter (Signed)
-----   Message from Riki AltesScott C Stoioff, MD sent at 04/08/2017 11:12 AM EDT ----- Blood work was normal including uric acid and calcium levels.  Proceed with 24-hour urine as we discussed

## 2017-04-15 ENCOUNTER — Encounter: Payer: Self-pay | Admitting: Urology

## 2017-04-16 ENCOUNTER — Ambulatory Visit: Payer: Federal, State, Local not specified - PPO

## 2017-05-12 ENCOUNTER — Other Ambulatory Visit: Payer: Federal, State, Local not specified - PPO

## 2017-07-07 ENCOUNTER — Encounter: Payer: Self-pay | Admitting: Urology

## 2017-07-07 ENCOUNTER — Encounter (INDEPENDENT_AMBULATORY_CARE_PROVIDER_SITE_OTHER): Payer: Federal, State, Local not specified - PPO | Admitting: Urology

## 2017-07-07 ENCOUNTER — Other Ambulatory Visit: Payer: Self-pay | Admitting: Urology

## 2017-07-15 NOTE — Progress Notes (Signed)
error 

## 2017-08-04 ENCOUNTER — Telehealth: Payer: Self-pay | Admitting: Urology

## 2017-08-04 NOTE — Telephone Encounter (Signed)
Please let patient know that his 24 urine study was reviewed.  His urine volume was significantly low at 0.89 L.  Urinary citrate levels were slightly low at 411 mg.  The most likely cause of his stone formation is decreased water intake with low urine volume.  Recommend he increase his water intake keep urine output greater than 2.5 L/day.  Drinking 3-4 L of water a day should accomplish this output.  His urinary citrate levels can be increased by drinking lemonade made with real lemon juice.  Recommend a follow-up visit with KUB in 3 months.

## 2017-08-08 NOTE — Telephone Encounter (Signed)
Letter sent.

## 2017-11-10 ENCOUNTER — Ambulatory Visit (INDEPENDENT_AMBULATORY_CARE_PROVIDER_SITE_OTHER): Payer: Federal, State, Local not specified - PPO | Admitting: Urology

## 2017-11-10 ENCOUNTER — Encounter: Payer: Self-pay | Admitting: Urology

## 2017-11-10 VITALS — BP 137/83 | HR 56 | Resp 16 | Ht 66.0 in | Wt 222.2 lb

## 2017-11-10 DIAGNOSIS — Z87442 Personal history of urinary calculi: Secondary | ICD-10-CM

## 2017-11-10 NOTE — Progress Notes (Signed)
11/10/2017 10:07 AM   Douglas Parsons 22-Oct-1965 [id number]  Referring provider: Marguarite Arbour, MD [address] [facility] [place], [place] [address]  Chief Complaint  Patient presents with  . Follow-up    HPI: 52 year old male presents for follow-up.  He was seen in October 2018 after passing 3 small stones and a subsequent CT showed a 1 to 2 mm calculus at the UVJ which she subsequently passed.  He was on a cholesterol-lowering agent at the time which had a potential side effect of an elevated uric acid.  He was unable to retrieve the stones so an analysis is not available.  A 24 urine study showed borderline hypocitraturia at 411 mg and a total urine volume of 890 mL.  He states he is presently drinking at least 4 L of water per day.  He denies flank or abdominal pain.  He has no bothersome lower urinary tract symptoms.   PMH: Past Medical History:  Diagnosis Date  . GERD (gastroesophageal reflux disease)   . Hypertension     Surgical History: Past Surgical History:  Procedure Laterality Date  . CARDIAC CATHETERIZATION    . TONSILLECTOMY  1994    Home Medications:  Allergies as of 11/10/2017      Reactions   Atorvastatin Other (See Comments)   Lovastatin Other (See Comments)   Rosuvastatin Other (See Comments)   Simvastatin Other (See Comments)   Statins Other (See Comments)   Muscle aches      Medication List        Accurate as of 11/10/17 10:07 AM. Always use your most recent med list.          aspirin 325 MG tablet Take 325 mg by mouth daily.   co-enzyme Q-10 30 MG capsule Take 30 mg by mouth daily.   ferrous sulfate 325 (65 FE) MG tablet Take 325 mg by mouth daily with breakfast.   fluticasone 50 MCG/ACT nasal spray Commonly known as:  FLONASE Place into the nose.   HYDROcodone-acetaminophen 10-325 MG tablet Commonly known as:  NORCO Take 1 tablet by mouth every 6 (six) hours as needed.   levalbuterol 45 MCG/ACT  inhaler Commonly known as:  XOPENEX HFA Inhale into the lungs.   magnesium 30 MG tablet Take 30 mg by mouth daily.   metoprolol succinate 50 MG 24 hr tablet Commonly known as:  TOPROL-XL Take 50 mg by mouth daily. Take with or immediately following a meal.   multivitamin capsule Take 1 capsule by mouth daily.   timolol 0.5 % ophthalmic gel-forming Commonly known as:  TIMOPTIC-XR instill 1 drop into both eyes once daily   vitamin B-12 1000 MCG tablet Commonly known as:  CYANOCOBALAMIN Take by mouth.   Vitamin D3 1000 units Caps Take by mouth 3 (three) times daily.       Allergies:  Allergies  Allergen Reactions  . Atorvastatin Other (See Comments)  . Lovastatin Other (See Comments)  . Rosuvastatin Other (See Comments)  . Simvastatin Other (See Comments)  . Statins Other (See Comments)    Muscle aches    Family History: Family History  Problem Relation Age of Onset  . Arthritis Mother   . COPD Mother   . Hyperlipidemia Mother   . Vision loss Mother   . Cancer Father   . Early death Father   . Heart disease Father   . Hyperlipidemia Father   . Hypertension Father     Social History:  reports that  he has quit smoking. His smoking use included cigarettes. He has never used smokeless tobacco. His alcohol and drug histories are not on file.  ROS: UROLOGY Frequent Urination?: No Hard to postpone urination?: No Burning/pain with urination?: No Get up at night to urinate?: No Leakage of urine?: No Urine stream starts and stops?: No Trouble starting stream?: No Do you have to strain to urinate?: No Blood in urine?: No Urinary tract infection?: No Sexually transmitted disease?: No Injury to kidneys or bladder?: No Painful intercourse?: No Weak stream?: No Erection problems?: No Penile pain?: No  Gastrointestinal Nausea?: No Vomiting?: No Indigestion/heartburn?: No Diarrhea?: No Constipation?: No  Constitutional Fever: No Night sweats?: No Weight  loss?: No Fatigue?: Yes  Skin Skin rash/lesions?: No Itching?: No  Eyes Blurred vision?: No Double vision?: No  Ears/Nose/Throat Sore throat?: No Sinus problems?: No  Hematologic/Lymphatic Swollen glands?: No Easy bruising?: No  Cardiovascular Leg swelling?: Yes Chest pain?: No  Respiratory Cough?: No Shortness of breath?: No  Endocrine Excessive thirst?: No  Musculoskeletal Back pain?: Yes Joint pain?: Yes  Neurological Headaches?: Yes Dizziness?: No  Psychologic Depression?: No Anxiety?: No  Physical Exam: BP 137/83   Pulse (!) 56   Resp 16   Ht 5\' 6"  (1.676 m)   Wt 222 lb 3.2 oz (100.8 kg)   SpO2 96%   BMI 35.86 kg/m   Constitutional:  Alert and oriented, No acute distress. HEENT: [place] AT, moist mucus membranes.  Trachea midline, no masses. Cardiovascular: No clubbing, cyanosis, or edema. Respiratory: Normal respiratory effort, no increased work of breathing. GI: Abdomen is soft, nontender, nondistended, no abdominal masses GU: No CVA tenderness Lymph: No cervical or inguinal lymphadenopathy. Skin: No rashes, bruises or suspicious lesions. Neurologic: Grossly intact, no focal deficits, moving all 4 extremities. Psychiatric: Normal mood and affect.   Assessment & Plan:   52 year old male with a history of urinary tract stones and 24 urine study showing low urine volume and mild hypocitraturia.  A KUB was ordered and he will get this done within the next 2 weeks and will be notified with results.  If negative would follow-up in 1 year with a KUB and to return earlier for recurrent renal colic.    Return in about 1 year (around 11/11/2018) for Recheck, KUB.  Riki AltesScott C Maurina Fawaz, MD  [facility] [address], [address] [place], [place] [address] [phone]

## 2017-11-21 ENCOUNTER — Emergency Department
Admission: EM | Admit: 2017-11-21 | Discharge: 2017-11-21 | Disposition: A | Discharge status: Home / Self Care | Payer: Federal, State, Local not specified - PPO | Attending: Emergency Medicine | Admitting: Emergency Medicine

## 2017-11-21 DIAGNOSIS — T782XXA Anaphylactic shock, unspecified, initial encounter: Secondary | ICD-10-CM | POA: Diagnosis not present

## 2017-11-21 DIAGNOSIS — Z87891 Personal history of nicotine dependence: Secondary | ICD-10-CM | POA: Diagnosis not present

## 2017-11-21 DIAGNOSIS — Z79899 Other long term (current) drug therapy: Secondary | ICD-10-CM | POA: Diagnosis not present

## 2017-11-21 DIAGNOSIS — T7840XA Allergy, unspecified, initial encounter: Secondary | ICD-10-CM

## 2017-11-21 DIAGNOSIS — Z7982 Long term (current) use of aspirin: Secondary | ICD-10-CM | POA: Diagnosis not present

## 2017-11-21 DIAGNOSIS — I10 Essential (primary) hypertension: Secondary | ICD-10-CM | POA: Diagnosis not present

## 2017-11-21 MED ORDER — EPINEPHRINE 0.3 MG/0.3ML IJ SOAJ: 0.3000 mg | Freq: Once | INTRAMUSCULAR | 0 refills | Status: AC

## 2017-11-21 MED ORDER — PREDNISONE 20 MG PO TABS: 60.0000 mg | ORAL_TABLET | Freq: Every day | ORAL | 0 refills | Status: AC

## 2017-11-21 MED ORDER — METHYLPREDNISOLONE SODIUM SUCC 125 MG IJ SOLR
125.0000 mg | Freq: Once | INTRAMUSCULAR | Status: AC
  Administered 2017-11-21: 125 mg via INTRAVENOUS

## 2017-11-21 MED ORDER — EPINEPHRINE 0.3 MG/0.3ML IJ SOAJ
0.3000 mg | Freq: Once | INTRAMUSCULAR | Status: AC
  Administered 2017-11-21: 0.3 mg via INTRAMUSCULAR

## 2017-11-21 MED ORDER — FAMOTIDINE IN NACL 20-0.9 MG/50ML-% IV SOLN
20.0000 mg | Freq: Once | INTRAVENOUS | Status: AC
  Administered 2017-11-21: 20 mg via INTRAVENOUS

## 2017-11-21 NOTE — ED Provider Notes (Signed)
[facility] Emergency Department Provider Note    First MD Initiated Contact with Patient 11/21/17 0425     (approximate)  I have reviewed the triage vital signs and the nursing notes.   HISTORY  Chief Complaint Allergic Reaction    HPI Douglas Parsons is a 52 y.o. male presents to the emergency department with generalized pruritic rash and swelling of the tongue, throat tightness which began shortly before arrival.  EMS administered 50 mg of IV Benadryl before arrival to the emergency department patient took 25 mg of Benadryl at home.  Patient denies any previous allergic reactions.  Patient states only thing that he did today that was unusual is that he had a burger from [employer] and he seldomly eats red meat.  Patient does admit to removing a tick from his right lower extremity 1 week ago.  Past Medical History:  Diagnosis Date  . GERD (gastroesophageal reflux disease)   . Hypertension     There are no active problems to display for this patient.   Past Surgical History:  Procedure Laterality Date  . CARDIAC CATHETERIZATION    . TONSILLECTOMY  1994    Prior to Admission medications   Medication Sig Start Date End Date Taking? Authorizing Provider  aspirin 325 MG tablet Take 325 mg by mouth daily.    [provider]  Cholecalciferol (VITAMIN D3) 1000 UNITS CAPS Take by mouth 3 (three) times daily.    [provider]  co-enzyme Q-10 30 MG capsule Take 30 mg by mouth daily.    [provider]  ferrous sulfate 325 (65 FE) MG tablet Take 325 mg by mouth daily with breakfast.    [provider]  fluticasone (FLONASE) 50 MCG/ACT nasal spray Place into the nose. 06/19/17 06/19/18  [provider]  HYDROcodone-acetaminophen (NORCO) 10-325 MG tablet Take 1 tablet by mouth every 6 (six) hours as needed.    [provider]  levalbuterol Pauline Aus(XOPENEX HFA) 45 MCG/ACT inhaler Inhale into the lungs. 06/30/17 06/30/18   [provider]  magnesium 30 MG tablet Take 30 mg by mouth daily.    [provider]  metoprolol succinate (TOPROL-XL) 50 MG 24 hr tablet Take 50 mg by mouth daily. Take with or immediately following a meal.     [provider]  Multiple Vitamin (MULTIVITAMIN) capsule Take 1 capsule by mouth daily.    [provider]  timolol (TIMOPTIC-XR) 0.5 % ophthalmic gel-forming instill 1 drop into both eyes once daily 01/22/17   [provider]  vitamin B-12 (CYANOCOBALAMIN) 1000 MCG tablet Take by mouth.    [provider]    Allergies Atorvastatin; Lovastatin; Rosuvastatin; Simvastatin; and Statins  Family History  Problem Relation Age of Onset  . Arthritis Mother   . COPD Mother   . Hyperlipidemia Mother   . Vision loss Mother   . Cancer Father   . Early death Father   . Heart disease Father   . Hyperlipidemia Father   . Hypertension Father     Social History Social History   Tobacco Use  . Smoking status: Former Smoker    Types: Cigarettes  . Smokeless tobacco: Current User  Substance Use Topics  . Alcohol use: Not on file  . Drug use: Not on file    Review of Systems Constitutional: No fever/chills Eyes: No visual changes. ENT: No sore throat. Cardiovascular: Denies chest pain. Respiratory: Denies shortness of breath. Gastrointestinal: No abdominal pain.  No nausea, no  vomiting.  No diarrhea.  No constipation. Genitourinary: Negative for dysuria. Musculoskeletal: Negative for neck pain.  Negative for back pain. Integumentary: Positive for rash. Neurological: Negative for headaches, focal weakness or numbness.   ____________________________________________   PHYSICAL EXAM:  VITAL SIGNS: ED Triage Vitals  Enc Vitals Group     BP 11/21/17 0421 121/84     Pulse Rate 11/21/17 0421 78     Resp 11/21/17 0430 (!) 24     Temp 11/21/17 0432 97.9 F (36.6 C)     Temp Source 11/21/17 0432 Oral     SpO2 11/21/17 0421  94 %     Weight 11/21/17 0426 99.8 kg (220 lb)     Height 11/21/17 0426 1.676 m (5\' 6" )     Head Circumference --      Peak Flow --      Pain Score 11/21/17 0426 0     Pain Loc --      Pain Edu? --      Excl. in GC? --     Constitutional: Alert and oriented. Well appearing and in no acute distress. Eyes: Conjunctivae are normal.  Head: Atraumatic. Mouth/Throat: Mucous membranes are moist. Oropharynx non-erythematous. Neck: No stridor.   Cardiovascular: Normal rate, regular rhythm. Good peripheral circulation. Grossly normal heart sounds. Respiratory: Normal respiratory effort.  No retractions. Lungs CTAB. Gastrointestinal: Soft and nontender. No distention.   Musculoskeletal: No lower extremity tenderness nor edema. No gross deformities of extremities. Neurologic:  Normal speech and language. No gross focal neurologic deficits are appreciated.  Skin: Generalized hives.      Procedures   ____________________________________________   INITIAL IMPRESSION / ASSESSMENT AND PLAN / ED COURSE  As part of my medical decision making, I reviewed the following data within the electronic MEDICAL RECORD NUMBER   52 year old male presented with above-stated history and physical exam consistent with anaphylaxis and as such patient was given 0.3 mg of IM epinephrine 125 mg of Solu-Medrol & 20 mg of Pepcid IV.  Consider the possibility of alpha gal allergic reaction given recent tick bite and beef ingestion.  Patient will be referred to allergist.  EpiPen and prednisone prescribed.  Patient's care transferred to Dr. Scotty Court for reevaluation and discharge after 4 hours ____________________________________________  FINAL CLINICAL IMPRESSION(S) / ED DIAGNOSES  Final diagnoses:  Allergic reaction, initial encounter  Anaphylaxis, initial encounter     MEDICATIONS GIVEN DURING THIS VISIT:  Medications  EPINEPHrine (EPI-PEN) injection 0.3 mg (0.3 mg Intramuscular Given 11/21/17 0419)    methylPREDNISolone sodium succinate (SOLU-MEDROL) 125 mg/2 mL injection 125 mg (125 mg Intravenous Given 11/21/17 0421)  famotidine (PEPCID) IVPB 20 mg premix (0 mg Intravenous Stopped 11/21/17 0453)     ED Discharge Orders    None       Note:  This document was prepared using Dragon voice recognition software and may include unintentional dictation errors.    Darci Current, MD 11/21/17 [phone]

## 2017-11-21 NOTE — ED Triage Notes (Signed)
Patient coming [facility] for allergic reaction. Patient has generalized over entire body. Patient c/o throat tightness. Patient reports being bitten by a tick last week on right lateral knee. Patient given 50mg  bendadryl IV by EMS. Patient taken 50 mg PO at 351 prior to transport.

## 2017-11-21 NOTE — ED Notes (Signed)
Patient reports decrease in throat discomfort

## 2017-11-21 NOTE — ED Notes (Signed)
This RN to bedside, introduced self to patient and family member at bedside. Pt is visualized in NAD. VSS at this time. Pt c/o continued feeling of swelling to hands and tongue, but no worsening of the swelling. This RN explained monitoring for rebound reaction, pt states understanding. This RN reviewed proper call bell use with patient and family member and explained to notify RN immediately if any worsening of swelling, rash, or itching occurs. Lights dimmed for patient comfort. Will continue to monitor for further patient needs.

## 2017-11-21 NOTE — ED Notes (Signed)
Patient's rash and hives have completely resolved. Patient c/o continued swelling to bilateral hands. MD Manson PasseyBrown informed.

## 2017-11-21 NOTE — ED Notes (Signed)
NAD noted at time of D/C. Pt denies questions or concerns. Pt ambulatory to the lobby at this time. Pt refused wheelchair to the lobby.  

## 2017-11-21 NOTE — ED Provider Notes (Signed)
  -----------------------------------------   10:36 AM on 11/21/2017 -----------------------------------------  Vital signs remain normal.  Patient remains asymptomatic.  Lung exam is normal, clear to auscultation bilaterally, no inducible wheezing or coughing with FEV1 maneuver.  Discussed the possibility of alpha galactosidase allergy from his multiple tick exposures over time.  He does report that he has had hives to red meat in the past, managed to avoid the symptoms by being vegetarian for 2 years, but now had the symptoms symptoms tonight much more severely after eating red meat.  Recommended avoiding red meat in the future and pursuing evaluation by an allergy specialist.  Patient prescribed prednisone and EpiPen by Dr. Manson PasseyBrown.  Recommended Benadryl for the next 24 hours as well.   Sharman CheekStafford, Brentley Landfair, MD 11/21/17 1037

## 2017-11-21 NOTE — ED Notes (Signed)
Rash and hives decreased compared to initial assessment

## 2017-11-21 NOTE — ED Notes (Signed)
Dr. Scotty CourtStafford at bedside to discuss D/C with patient.

## 2017-11-21 NOTE — ED Notes (Signed)
Pt resting in bed in L lateral recumbent position. Respirations even and unlabored. Skin warm, dry, and intact. Pt's family member remains bedside at this time. No change in patient condition. Will continue to monitor for further patient needs.

## 2017-11-21 NOTE — ED Notes (Signed)
Pt resting in bed with eyes close, respirations even and unlabored, skin warm, dry, and intact. VSS and WNL. Will continue to monitor for further patient needs.

## 2018-01-09 ENCOUNTER — Other Ambulatory Visit: Payer: Self-pay | Admitting: Internal Medicine

## 2018-01-09 DIAGNOSIS — G459 Transient cerebral ischemic attack, unspecified: Secondary | ICD-10-CM

## 2018-01-20 ENCOUNTER — Ambulatory Visit
Admission: RE | Admit: 2018-01-20 | Discharge: 2018-01-20 | Disposition: A | Discharge status: Home / Self Care | Payer: Federal, State, Local not specified - PPO | Source: Ambulatory Visit | Attending: Internal Medicine | Admitting: Internal Medicine

## 2018-01-20 DIAGNOSIS — G459 Transient cerebral ischemic attack, unspecified: Secondary | ICD-10-CM | POA: Diagnosis not present

## 2018-04-14 ENCOUNTER — Ambulatory Visit: Payer: Federal, State, Local not specified - PPO | Admitting: Anesthesiology

## 2018-04-14 ENCOUNTER — Other Ambulatory Visit: Payer: Self-pay | Admitting: Anesthesiology

## 2018-04-14 ENCOUNTER — Other Ambulatory Visit: Payer: Self-pay

## 2018-04-14 ENCOUNTER — Encounter: Payer: Self-pay | Admitting: Anesthesiology

## 2018-04-14 ENCOUNTER — Ambulatory Visit
Admission: RE | Admit: 2018-04-14 | Discharge: 2018-04-14 | Disposition: A | Discharge status: Home / Self Care | Payer: Federal, State, Local not specified - PPO | Source: Ambulatory Visit | Attending: Anesthesiology | Admitting: Anesthesiology

## 2018-04-14 VITALS — BP 119/80 | HR 68 | Temp 98.4°F | Resp 20 | Ht 66.0 in | Wt 221.0 lb

## 2018-04-14 DIAGNOSIS — M5136 Other intervertebral disc degeneration, lumbar region: Secondary | ICD-10-CM

## 2018-04-14 DIAGNOSIS — R52 Pain, unspecified: Secondary | ICD-10-CM | POA: Insufficient documentation

## 2018-04-14 DIAGNOSIS — M5432 Sciatica, left side: Secondary | ICD-10-CM

## 2018-04-14 DIAGNOSIS — M48062 Spinal stenosis, lumbar region with neurogenic claudication: Secondary | ICD-10-CM

## 2018-04-14 DIAGNOSIS — M47817 Spondylosis without myelopathy or radiculopathy, lumbosacral region: Secondary | ICD-10-CM

## 2018-04-14 MED ORDER — LIDOCAINE HCL (PF) 1 % IJ SOLN
5.0000 mL | Freq: Once | INTRAMUSCULAR | Status: AC
  Administered 2018-04-14: 5 mL via SUBCUTANEOUS
  Filled 2018-04-14: qty 5

## 2018-04-14 MED ORDER — LACTATED RINGERS IV SOLN
1000.0000 mL | INTRAVENOUS | Status: DC
  Administered 2018-04-14: 1000 mL via INTRAVENOUS

## 2018-04-14 MED ORDER — TRIAMCINOLONE ACETONIDE 40 MG/ML IJ SUSP
40.0000 mg | Freq: Once | INTRAMUSCULAR | Status: AC
  Administered 2018-04-14: 40 mg
  Filled 2018-04-14: qty 1

## 2018-04-14 MED ORDER — ROPIVACAINE HCL 2 MG/ML IJ SOLN
10.0000 mL | Freq: Once | INTRAMUSCULAR | Status: AC
  Administered 2018-04-14: 1 mL via EPIDURAL
  Filled 2018-04-14: qty 10

## 2018-04-14 MED ORDER — MIDAZOLAM HCL 2 MG/2ML IJ SOLN
5.0000 mg | Freq: Once | INTRAMUSCULAR | Status: AC
  Administered 2018-04-14: 4 mg via INTRAVENOUS
  Filled 2018-04-14: qty 5

## 2018-04-14 MED ORDER — SODIUM CHLORIDE 0.9% FLUSH
10.0000 mL | Freq: Once | INTRAVENOUS | Status: AC
  Administered 2018-04-14: 10 mL

## 2018-04-14 MED ORDER — MIDAZOLAM HCL 5 MG/5ML IJ SOLN
INTRAMUSCULAR | Status: AC
  Filled 2018-04-14: qty 5

## 2018-04-14 MED ORDER — IOPAMIDOL (ISOVUE-M 200) INJECTION 41%
20.0000 mL | Freq: Once | INTRAMUSCULAR | Status: DC | PRN
  Administered 2018-04-14: 10 mL
  Filled 2018-04-14: qty 20

## 2018-04-14 NOTE — Patient Instructions (Signed)
____________________________________________________________________________________________  Post-Procedure Discharge Instructions  Instructions:  Apply ice: Fill a plastic sandwich bag with crushed ice. Cover it with a small towel and apply to injection site. Apply for 15 minutes then remove x 15 minutes. Repeat sequence on day of procedure, until you go to bed. The purpose is to minimize swelling and discomfort after procedure.  Apply heat: Apply heat to procedure site starting the day following the procedure. The purpose is to treat any soreness and discomfort from the procedure.  Food intake: Start with clear liquids (like water) and advance to regular food, as tolerated.   Physical activities: Keep activities to a minimum for the first 8 hours after the procedure.   Driving: If you have received any sedation, you are not allowed to drive for 24 hours after your procedure.  Blood thinner: Restart your blood thinner 6 hours after your procedure. (Only for those taking blood thinners)  Insulin: As soon as you can eat, you may resume your normal dosing schedule. (Only for those taking insulin)  Infection prevention: Keep procedure site clean and dry.  Post-procedure Pain Diary: Extremely important that this be done correctly and accurately. Recorded information will be used to determine the next step in treatment.  Pain evaluated is that of treated area only. Do not include pain from an untreated area.  Complete every hour, on the hour, for the initial 8 hours. Set an alarm to help you do this part accurately.  Do not go to sleep and have it completed later. It will not be accurate.  Follow-up appointment: Keep your follow-up appointment after the procedure. Usually 2 weeks for most procedures. (6 weeks in the case of radiofrequency.) Bring you pain diary.   Expect:  From numbing medicine (AKA: Local Anesthetics): Numbness or decrease in pain.  Onset: Full effect within 15  minutes of injected.  Duration: It will depend on the type of local anesthetic used. On the average, 1 to 8 hours.   From steroids: Decrease in swelling or inflammation. Once inflammation is improved, relief of the pain will follow.  Onset of benefits: Depends on the amount of swelling present. The more swelling, the longer it will take for the benefits to be seen. In some cases, up to 10 days.  Duration: Steroids will stay in the system x 2 weeks. Duration of benefits will depend on multiple posibilities including persistent irritating factors.  Occasional side-effects: Facial flushing, cramps (if present, drink Gatorade and take over-the-counter Magnesium 450-500 mg once to twice a day).  From procedure: Some discomfort is to be expected once the numbing medicine wears off. This should be minimal if ice and heat are applied as instructed.  Call if:  You experience numbness and weakness that gets worse with time, as opposed to wearing off.  New onset bowel or bladder incontinence. (This applies to Spinal procedures only)  Emergency Numbers:  Durning business hours (Monday - Thursday, 8:00 AM - 4:00 PM) (Friday, 9:00 AM - 12:00 Noon): [phone]  After hours: [phone] ____________________________________________________________________________________________   Epidural Steroid Injection Patient Information  Description: The epidural space surrounds the nerves as they exit the spinal cord.  In some patients, the nerves can be compressed and inflamed by a bulging disc or a tight spinal canal (spinal stenosis).  By injecting steroids into the epidural space, we can bring irritated nerves into direct contact with a potentially helpful medication.  These steroids act directly on the irritated nerves and can reduce swelling and inflammation which  often leads to decreased pain.  Epidural steroids may be injected anywhere along the spine and from the neck to the low back depending  upon the location of your pain.   After numbing the skin with local anesthetic (like Novocaine), a small needle is passed into the epidural space slowly.  You may experience a sensation of pressure while this is being done.  The entire block usually last less than 10 minutes.  Conditions which may be treated by epidural steroids:   Low back and leg pain  Neck and arm pain  Spinal stenosis  Post-laminectomy syndrome  Herpes zoster (shingles) pain  Pain from compression fractures  Preparation for the injection:  1. Do not eat any solid food or dairy products within 8 hours of your appointment.  2. You may drink clear liquids up to 3 hours before appointment.  Clear liquids include water, black coffee, juice or soda.  No milk or cream please. 3. You may take your regular medication, including pain medications, with a sip of water before your appointment  Diabetics should hold regular insulin (if taken separately) and take 1/2 normal NPH dos the morning of the procedure.  Carry some sugar containing items with you to your appointment. 4. A driver must accompany you and be prepared to drive you home after your procedure.  5. Bring all your current medications with your. 6. An IV may be inserted and sedation may be given at the discretion of the physician.   7. A blood pressure cuff, EKG and other monitors will often be applied during the procedure.  Some patients may need to have extra oxygen administered for a short period. 8. You will be asked to provide medical information, including your allergies, prior to the procedure.  We must know immediately if you are taking blood thinners (like Coumadin/Warfarin)  Or if you are allergic to IV iodine contrast (dye). We must know if you could possible be pregnant.  Possible side-effects:  Bleeding from needle site  Infection (rare, may require surgery)  Nerve injury (rare)  Numbness & tingling (temporary)  Difficulty urinating (rare,  temporary)  Spinal headache ( a headache worse with upright posture)  Light -headedness (temporary)  Pain at injection site (several days)  Decreased blood pressure (temporary)  Weakness in arm/leg (temporary)  Pressure sensation in back/neck (temporary)  Call if you experience:  Fever/chills associated with headache or increased back/neck pain.  Headache worsened by an upright position.  New onset weakness or numbness of an extremity below the injection site  Hives or difficulty breathing (go to the emergency room)  Inflammation or drainage at the infection site  Severe back/neck pain  Any new symptoms which are concerning to you  Please note:  Although the local anesthetic injected can often make your back or neck feel good for several hours after the injection, the pain will likely return.  It takes 3-7 days for steroids to work in the epidural space.  You may not notice any pain relief for at least that one week.  If effective, we will often do a series of three injections spaced 3-6 weeks apart to maximally decrease your pain.  After the initial series, we generally will wait several months before considering a repeat injection of the same type.  If you have any questions, please call [phone] [facility]

## 2018-04-14 NOTE — Progress Notes (Signed)
Safety precautions to be maintained throughout the outpatient stay will include: orient to surroundings, keep bed in low position, maintain call bell within reach at all times, provide assistance with transfer out of bed and ambulation.  

## 2018-04-14 NOTE — Progress Notes (Signed)
Subjective:  Patient ID: Douglas Parsons, male    DOB: Sep 08, 1965  Age: 52 y.o. MRN: [id number]  CC: Back Pain (low) and Hip Pain (left)   Procedure: L 5 S1 epidural steroid under fluoroscopic guidance with moderate sedation  HPI [name] presents for reevaluation.  He was last seen back in 2018 for his low back pain.  He is also had epidural steroid injections in the past for his left lower extremity pain and calf and foot pain.  He gets occasional numbness when he is been up and standing or walking for prolonged period of time.  Occasionally he will get some mild weakness with this as well.  No change in bowel or bladder function is been noted.  In the past he has had epidural steroids that have significantly reduce the severity of his lower leg pain and back pain.  He is also had trigger point injections for back spasming in the past.  Outpatient Medications Prior to Visit  Medication Sig Dispense Refill  . aspirin 325 MG tablet Take 325 mg by mouth daily.    . Cholecalciferol (VITAMIN D3) 1000 UNITS CAPS Take by mouth 3 (three) times daily.    Marland Kitchen co-enzyme Q-10 30 MG capsule Take 30 mg by mouth daily.    Marland Kitchen EPINEPHrine 0.3 mg/0.3 mL IJ SOAJ injection Inject into the muscle.    . ferrous sulfate 325 (65 FE) MG tablet Take 325 mg by mouth daily with breakfast.    . fluticasone (FLONASE) 50 MCG/ACT nasal spray Place into the nose.    Marland Kitchen HYDROcodone-acetaminophen (NORCO) 10-325 MG tablet Take 1 tablet by mouth every 6 (six) hours as needed.    . levalbuterol (XOPENEX HFA) 45 MCG/ACT inhaler Inhale into the lungs.    . magnesium 30 MG tablet Take 30 mg by mouth daily.    . metoprolol succinate (TOPROL-XL) 50 MG 24 hr tablet Take 50 mg by mouth daily. Take with or immediately following a meal.     . Multiple Vitamin (MULTIVITAMIN) capsule Take 1 capsule by mouth daily.    . timolol (TIMOPTIC-XR) 0.5 % ophthalmic gel-forming instill 1 drop into both eyes once daily    . vitamin B-12  (CYANOCOBALAMIN) 1000 MCG tablet Take by mouth.     Facility-Administered Medications Prior to Visit  Medication Dose Route Frequency Provider Last Rate Last Dose  . dexamethasone (DECADRON) injection 10 mg  10 mg Other Once Yevette Edwards, MD      . dexamethasone (DECADRON) injection 4 mg  4 mg Other Once Yevette Edwards, MD      . iopamidol (ISOVUE-M) 41 % intrathecal injection 20 mL  20 mL Intrathecal Once PRN Yevette Edwards, MD      . lactated ringers infusion 1,000 mL  1,000 mL Intravenous Continuous Yevette Edwards, MD      . lidocaine (PF) (XYLOCAINE) 1 % injection 5 mL  5 mL Subcutaneous Once Yevette Edwards, MD      . midazolam (VERSED) injection 5 mg  5 mg Intravenous Once Yevette Edwards, MD      . ropivacaine (PF) (NAROPIN) injection 10 mL  10 mL Infiltration Once Yevette Edwards, MD      . ropivacaine (PF) 2 mg/ml (0.2%) (NAROPIN) epidural 10 mL  10 mL Epidural Once Yevette Edwards, MD      . ropivacaine (PF) 2 mg/ml (0.2%) (NAROPIN) epidural 10 mL  10 mL Epidural Once Yevette Edwards, MD      .  ropivacaine (PF) 2 mg/mL (0.2%) (NAROPIN) injection 1 mL  1 mL Epidural Once Yevette Edwards, MD      . sodium chloride flush (NS) 0.9 % injection 10 mL  10 mL Other Once Yevette Edwards, MD      . triamcinolone acetonide (KENALOG-40) injection 40 mg  40 mg Other Once Yevette Edwards, MD      . triamcinolone acetonide (KENALOG-40) injection 40 mg  40 mg Other Once Yevette Edwards, MD        Review of Systems CNS: No confusion or sedation Cardiac: No angina or palpitations GI: No abdominal pain or constipation Constitutional: No nausea vomiting fevers or chills  Objective:  BP 139/72   Pulse 68   Temp 98.4 F (36.9 C)   Resp (!) 30   Ht 5\' 6"  (1.676 m)   Wt 221 lb (100.2 kg)   SpO2 97%   BMI 35.67 kg/m    BP Readings from Last 3 Encounters:  04/14/18 139/72  11/21/17 119/73  11/10/17 137/83     Wt Readings from Last 3 Encounters:  04/14/18 221 lb (100.2 kg)  11/21/17 220 lb  (99.8 kg)  11/10/17 222 lb 3.2 oz (100.8 kg)     Physical Exam Pt is alert and oriented PERRL EOMI HEART IS RRR no murmur or rub LCTA no wheezing or rales MUSCULOSKELETAL reveals some paraspinous muscle tenderness but no overt trigger points.  He walks with a mildly antalgic gait.  Muscle tone and bulk is good  Labs  No results found for: HGBA1C Lab Results  Component Value Date   CREATININE 0.67 (L) 04/04/2017    -------------------------------------------------------------------------------------------------------------------- Lab Results  Component Value Date   WBC 8.2 03/13/2013   HGB 15.0 03/13/2013   HCT 43.6 03/13/2013   PLT 240 03/13/2013   GLUCOSE 95 04/04/2017   NA 140 04/04/2017   K 4.5 04/04/2017   CL 102 04/04/2017   CREATININE 0.67 (L) 04/04/2017   BUN 14 04/04/2017   CO2 23 04/04/2017   TSH 0.538 03/13/2013   INR 0.9 04/03/2012    --------------------------------------------------------------------------------------------------------------------- Ct Head Wo Contrast  Result Date: 01/21/2018 CLINICAL DATA:  Visual alteration. Transient upper extremity weakness on the right EXAM: CT HEAD WITHOUT CONTRAST TECHNIQUE: Contiguous axial images were obtained from the base of the skull through the vertex without intravenous contrast. COMPARISON:  November 24, 2006 FINDINGS: Brain: The ventricles are normal in size and configuration. There is no intracranial mass, hemorrhage, extra-axial fluid collection, or midline shift. The gray-white compartments appear normal. No evident acute infarct. Vascular: No hyperdense vessel. There is mild calcification in the left carotid siphon. Skull: The bony calvarium appears intact. Sinuses/Orbits: There is mucosal thickening in several ethmoid air cells. Other visualized paranasal sinuses are clear. Visualized orbits appear symmetric bilaterally. Other: Visualized mastoid air cells are clear. IMPRESSION: No mass or hemorrhage.  Gray-white  compartments appear normal. There is calcification in the left carotid siphon region. There is mucosal thickening in several ethmoid air cells. Electronically Signed   By: Bretta Bang III M.D.   On: 01/21/2018 07:53     Assessment & Plan:   Stanislaw was seen today for back pain and hip pain.  Diagnoses and all orders for this visit:  DDD (degenerative disc disease), lumbar -     Lumbar Epidural Injection; Future  Sciatica of left side -     Lumbar Epidural Injection; Future  Lumbosacral spondylosis without myelopathy -     Lumbar Epidural Injection;  Future  Spinal stenosis of lumbar region with neurogenic claudication -     Lumbar Epidural Injection; Future  Other orders -     triamcinolone acetonide (KENALOG-40) injection 40 mg -     sodium chloride flush (NS) 0.9 % injection 10 mL -     ropivacaine (PF) 2 mg/mL (0.2%) (NAROPIN) injection 10 mL -     midazolam (VERSED) injection 5 mg -     lidocaine (PF) (XYLOCAINE) 1 % injection 5 mL -     lactated ringers infusion 1,000 mL -     iopamidol (ISOVUE-M) 41 % intrathecal injection 20 mL        ----------------------------------------------------------------------------------------------------------------------  Problem List Items Addressed This Visit    None    Visit Diagnoses    DDD (degenerative disc disease), lumbar    -  Primary   Relevant Medications   triamcinolone acetonide (KENALOG-40) injection 40 mg (Completed)   Other Relevant Orders   Lumbar Epidural Injection   Sciatica of left side       Relevant Medications   midazolam (VERSED) injection 5 mg (Completed)   Other Relevant Orders   Lumbar Epidural Injection   Lumbosacral spondylosis without myelopathy       Relevant Medications   triamcinolone acetonide (KENALOG-40) injection 40 mg (Completed)   Other Relevant Orders   Lumbar Epidural Injection   Spinal stenosis of lumbar region with neurogenic claudication       Relevant Medications    triamcinolone acetonide (KENALOG-40) injection 40 mg (Completed)   Other Relevant Orders   Lumbar Epidural Injection        ----------------------------------------------------------------------------------------------------------------------  1. DDD (degenerative disc disease), lumbar We will schedule today for an epidural steroid injection.  We have gone over the risks and benefits with him in full detail and his questions been answered.  We will plan on a return visit in approximately 1 month for possible repeat epidural at that time.  He is to continue with core stretching strengthening exercises as previously reviewed. - Lumbar Epidural Injection; Future  2. Sciatica of left side As above - Lumbar Epidural Injection; Future  3. Lumbosacral spondylosis without myelopathy As above - Lumbar Epidural Injection; Future  4. Spinal stenosis of lumbar region with neurogenic claudication As above - Lumbar Epidural Injection; Future    ----------------------------------------------------------------------------------------------------------------------  I am having Ariana P. Klauer maintain his metoprolol succinate, multivitamin, Vitamin D3, magnesium, ferrous sulfate, aspirin, co-enzyme Q-10, HYDROcodone-acetaminophen, vitamin B-12, fluticasone, levalbuterol, timolol, and EPINEPHrine. We administered triamcinolone acetonide, sodium chloride flush, ropivacaine (PF) 2 mg/mL (0.2%), midazolam, lidocaine (PF), lactated ringers, and iopamidol. We will continue to administer triamcinolone acetonide, sodium chloride flush, ropivacaine (PF) 2 mg/mL (0.2%), midazolam, lidocaine (PF), lactated ringers, iopamidol, dexamethasone, ropivacaine (PF) 2 mg/mL (0.2%), triamcinolone acetonide, ropivacaine (PF) 5 mg/mL (0.5%), dexamethasone, and ropivacaine (PF) 2 mg/mL (0.2%).   Meds ordered this encounter  Medications  . triamcinolone acetonide (KENALOG-40) injection 40 mg  . sodium chloride flush (NS)  0.9 % injection 10 mL  . ropivacaine (PF) 2 mg/mL (0.2%) (NAROPIN) injection 10 mL  . midazolam (VERSED) injection 5 mg  . lidocaine (PF) (XYLOCAINE) 1 % injection 5 mL  . lactated ringers infusion 1,000 mL  . iopamidol (ISOVUE-M) 41 % intrathecal injection 20 mL   Patient's Medications  New Prescriptions   No medications on file  Previous Medications   ASPIRIN 325 MG TABLET    Take 325 mg by mouth daily.   CHOLECALCIFEROL (VITAMIN D3) 1000 UNITS CAPS  Take by mouth 3 (three) times daily.   CO-ENZYME Q-10 30 MG CAPSULE    Take 30 mg by mouth daily.   EPINEPHRINE 0.3 MG/0.3 ML IJ SOAJ INJECTION    Inject into the muscle.   FERROUS SULFATE 325 (65 FE) MG TABLET    Take 325 mg by mouth daily with breakfast.   FLUTICASONE (FLONASE) 50 MCG/ACT NASAL SPRAY    Place into the nose.   HYDROCODONE-ACETAMINOPHEN (NORCO) 10-325 MG TABLET    Take 1 tablet by mouth every 6 (six) hours as needed.   LEVALBUTEROL (XOPENEX HFA) 45 MCG/ACT INHALER    Inhale into the lungs.   MAGNESIUM 30 MG TABLET    Take 30 mg by mouth daily.   METOPROLOL SUCCINATE (TOPROL-XL) 50 MG 24 HR TABLET    Take 50 mg by mouth daily. Take with or immediately following a meal.    MULTIPLE VITAMIN (MULTIVITAMIN) CAPSULE    Take 1 capsule by mouth daily.   TIMOLOL (TIMOPTIC-XR) 0.5 % OPHTHALMIC GEL-FORMING    instill 1 drop into both eyes once daily   VITAMIN B-12 (CYANOCOBALAMIN) 1000 MCG TABLET    Take by mouth.  Modified Medications   No medications on file  Discontinued Medications   No medications on file   ----------------------------------------------------------------------------------------------------------------------  Follow-up: Return in about 1 month (around 05/14/2018) for evaluation, procedure.   Procedure: L5-S1 LESI with fluoroscopic guidance and with moderate sedation  NOTE: The risks, benefits, and expectations of the procedure have been discussed and explained to the patient who was understanding and in  agreement with suggested treatment plan. No guarantees were made.  DESCRIPTION OF PROCEDURE: Lumbar epidural steroid injection with 4 mg IV Versed, EKG, blood pressure, pulse, and pulse oximetry monitoring. The procedure was performed with the patient in the prone position under fluoroscopic guidance.  Sterile prep x3 was initiated and I then injected subcutaneous lidocaine to the overlying L5-S1 site after its fluoroscopic identifictation.  Using strict aseptic technique, I then advanced an 18-gauge Tuohy epidural needle in the midline using interlaminar approach via loss-of-resistance to saline technique. There was negative aspiration for heme or  CSF.  I then confirmed position with both AP and Lateral fluoroscan.  2 cc of Isovue were injected and a  total of 5 mL of Preservative-Free normal saline mixed with 40 mg of Kenalog and 1cc Ropicaine 0.2 percent were injected incrementally via the  epidurally placed needle. The needle was removed. The patient tolerated the injection well and was convalesced and discharged to home in stable condition. Should the patient have any post procedure difficulty they have been instructed on how to contact us for assistance.    Yevette Edwards, MD

## 2018-04-15 ENCOUNTER — Telehealth: Payer: Self-pay | Admitting: *Deleted

## 2018-04-15 NOTE — Telephone Encounter (Signed)
No answer    Left voicemail

## 2018-05-12 ENCOUNTER — Other Ambulatory Visit: Payer: Self-pay | Admitting: Anesthesiology

## 2018-05-12 ENCOUNTER — Ambulatory Visit
Admission: RE | Admit: 2018-05-12 | Discharge: 2018-05-12 | Disposition: A | Discharge status: Home / Self Care | Payer: Federal, State, Local not specified - PPO | Source: Ambulatory Visit | Attending: Anesthesiology | Admitting: Anesthesiology

## 2018-05-12 ENCOUNTER — Ambulatory Visit (HOSPITAL_BASED_OUTPATIENT_CLINIC_OR_DEPARTMENT_OTHER): Payer: Federal, State, Local not specified - PPO | Admitting: Anesthesiology

## 2018-05-12 ENCOUNTER — Encounter: Payer: Self-pay | Admitting: Anesthesiology

## 2018-05-12 ENCOUNTER — Other Ambulatory Visit: Payer: Self-pay

## 2018-05-12 VITALS — BP 124/89 | HR 73 | Temp 98.3°F | Resp 18 | Ht 66.0 in | Wt 221.0 lb

## 2018-05-12 DIAGNOSIS — M5136 Other intervertebral disc degeneration, lumbar region: Secondary | ICD-10-CM | POA: Diagnosis not present

## 2018-05-12 DIAGNOSIS — M48062 Spinal stenosis, lumbar region with neurogenic claudication: Secondary | ICD-10-CM | POA: Insufficient documentation

## 2018-05-12 DIAGNOSIS — Z7982 Long term (current) use of aspirin: Secondary | ICD-10-CM | POA: Diagnosis not present

## 2018-05-12 DIAGNOSIS — Z79899 Other long term (current) drug therapy: Secondary | ICD-10-CM | POA: Diagnosis not present

## 2018-05-12 DIAGNOSIS — M25552 Pain in left hip: Secondary | ICD-10-CM | POA: Diagnosis present

## 2018-05-12 DIAGNOSIS — M5432 Sciatica, left side: Secondary | ICD-10-CM

## 2018-05-12 DIAGNOSIS — M47817 Spondylosis without myelopathy or radiculopathy, lumbosacral region: Secondary | ICD-10-CM

## 2018-05-12 DIAGNOSIS — R52 Pain, unspecified: Secondary | ICD-10-CM

## 2018-05-12 DIAGNOSIS — M5116 Intervertebral disc disorders with radiculopathy, lumbar region: Secondary | ICD-10-CM | POA: Insufficient documentation

## 2018-05-12 MED ORDER — IOPAMIDOL (ISOVUE-M 200) INJECTION 41%
20.0000 mL | Freq: Once | INTRAMUSCULAR | Status: DC | PRN
  Administered 2018-05-12: 5 mL
  Filled 2018-05-12: qty 20

## 2018-05-12 MED ORDER — IOPAMIDOL (ISOVUE-M 200) INJECTION 41%
INTRAMUSCULAR | Status: AC
  Filled 2018-05-12: qty 10

## 2018-05-12 MED ORDER — LIDOCAINE HCL (PF) 1 % IJ SOLN
5.0000 mL | Freq: Once | INTRAMUSCULAR | Status: AC
  Administered 2018-05-12: 5 mL via SUBCUTANEOUS

## 2018-05-12 MED ORDER — LIDOCAINE HCL (PF) 1 % IJ SOLN
INTRAMUSCULAR | Status: AC
  Filled 2018-05-12: qty 5

## 2018-05-12 MED ORDER — MIDAZOLAM HCL 5 MG/5ML IJ SOLN
5.0000 mg | Freq: Once | INTRAMUSCULAR | Status: AC
  Administered 2018-05-12: 3 mg via INTRAVENOUS

## 2018-05-12 MED ORDER — ROPIVACAINE HCL 2 MG/ML IJ SOLN
10.0000 mL | Freq: Once | INTRAMUSCULAR | Status: AC
  Administered 2018-05-12: 10 mL via EPIDURAL

## 2018-05-12 MED ORDER — SODIUM CHLORIDE 0.9% FLUSH
10.0000 mL | Freq: Once | INTRAVENOUS | Status: AC
  Administered 2018-05-12: 1 mL

## 2018-05-12 MED ORDER — MIDAZOLAM HCL 5 MG/5ML IJ SOLN
INTRAMUSCULAR | Status: AC
  Filled 2018-05-12: qty 5

## 2018-05-12 MED ORDER — SODIUM CHLORIDE (PF) 0.9 % IJ SOLN
INTRAMUSCULAR | Status: AC
  Filled 2018-05-12: qty 10

## 2018-05-12 MED ORDER — TRIAMCINOLONE ACETONIDE 40 MG/ML IJ SUSP
INTRAMUSCULAR | Status: AC
  Filled 2018-05-12: qty 1

## 2018-05-12 MED ORDER — TRIAMCINOLONE ACETONIDE 40 MG/ML IJ SUSP
40.0000 mg | Freq: Once | INTRAMUSCULAR | Status: AC
  Administered 2018-05-12: 40 mg

## 2018-05-12 MED ORDER — ROPIVACAINE HCL 2 MG/ML IJ SOLN
INTRAMUSCULAR | Status: AC
  Filled 2018-05-12: qty 10

## 2018-05-12 MED ORDER — LACTATED RINGERS IV SOLN
1000.0000 mL | INTRAVENOUS | Status: DC
  Administered 2018-05-12: 1000 mL via INTRAVENOUS

## 2018-05-12 NOTE — Progress Notes (Signed)
Subjective:  Patient ID: Douglas Parsons, male    DOB: Jun 15, 1965  Age: 52 y.o. MRN: [id number]  CC: Hip Pain (left)   Procedure: L3-L4 lumbar epidural steroid under fluoroscopic guidance with moderate sedation HPI KAIYDEN SIMKIN presents for reevaluation today.  He was last seen a month ago and had an L5-S1 epidural steroid at that time.  He is still having some significant pain radiating into the left posterior lateral leg and hip.  He reports a transient relief following his first epidural injection however this pain has recurred.  No change in lower extremity strength or function has been noted.  He has been using some Tylenol or anti-inflammatories for pain relief.  He is failed to gain any significant improvement with physical therapy modalities.  Otherwise is in his usual state of health at this time.  Outpatient Medications Prior to Visit  Medication Sig Dispense Refill  . aspirin 325 MG tablet Take 325 mg by mouth daily.    . Cholecalciferol (VITAMIN D3) 1000 UNITS CAPS Take by mouth 3 (three) times daily.    Marland Kitchen co-enzyme Q-10 30 MG capsule Take 30 mg by mouth daily.    Marland Kitchen EPINEPHrine 0.3 mg/0.3 mL IJ SOAJ injection Inject into the muscle.    . ferrous sulfate 325 (65 FE) MG tablet Take 325 mg by mouth daily with breakfast.    . fluticasone (FLONASE) 50 MCG/ACT nasal spray Place into the nose.    Marland Kitchen HYDROcodone-acetaminophen (NORCO) 10-325 MG tablet Take 1 tablet by mouth every 6 (six) hours as needed.    . levalbuterol (XOPENEX HFA) 45 MCG/ACT inhaler Inhale into the lungs.    . magnesium 30 MG tablet Take 30 mg by mouth daily.    . metoprolol succinate (TOPROL-XL) 50 MG 24 hr tablet Take 50 mg by mouth daily. Take with or immediately following a meal.     . Multiple Vitamin (MULTIVITAMIN) capsule Take 1 capsule by mouth daily.    . timolol (TIMOPTIC-XR) 0.5 % ophthalmic gel-forming instill 1 drop into both eyes once daily    . vitamin B-12 (CYANOCOBALAMIN) 1000 MCG tablet Take by mouth.     . timolol (TIMOPTIC) 0.5 % ophthalmic solution      Facility-Administered Medications Prior to Visit  Medication Dose Route Frequency Provider Last Rate Last Dose  . dexamethasone (DECADRON) injection 10 mg  10 mg Other Once Yevette Edwards, MD      . dexamethasone (DECADRON) injection 4 mg  4 mg Other Once Yevette Edwards, MD      . iopamidol (ISOVUE-M) 41 % intrathecal injection 20 mL  20 mL Intrathecal Once PRN Yevette Edwards, MD      . lactated ringers infusion 1,000 mL  1,000 mL Intravenous Continuous Yevette Edwards, MD      . lidocaine (PF) (XYLOCAINE) 1 % injection 5 mL  5 mL Subcutaneous Once Yevette Edwards, MD      . midazolam (VERSED) injection 5 mg  5 mg Intravenous Once Yevette Edwards, MD      . ropivacaine (PF) (NAROPIN) injection 10 mL  10 mL Infiltration Once Yevette Edwards, MD      . ropivacaine (PF) 2 mg/ml (0.2%) (NAROPIN) epidural 10 mL  10 mL Epidural Once Yevette Edwards, MD      . ropivacaine (PF) 2 mg/ml (0.2%) (NAROPIN) epidural 10 mL  10 mL Epidural Once Yevette Edwards, MD      . ropivacaine (PF) 2 mg/mL (  0.2%) (NAROPIN) injection 1 mL  1 mL Epidural Once Yevette EdwardsAdams, James G, MD      . sodium chloride flush (NS) 0.9 % injection 10 mL  10 mL Other Once Yevette EdwardsAdams, James G, MD      . triamcinolone acetonide (KENALOG-40) injection 40 mg  40 mg Other Once Yevette EdwardsAdams, James G, MD      . triamcinolone acetonide (KENALOG-40) injection 40 mg  40 mg Other Once Yevette EdwardsAdams, James G, MD        Review of Systems CNS: No confusion or sedation Cardiac: No angina or palpitations GI: No abdominal pain or constipation Constitutional: No nausea vomiting fevers or chills  Objective:  BP 124/89   Pulse 73   Temp 98.3 F (36.8 C)   Resp 18   Ht 5\' 6"  (1.676 m)   Wt 221 lb (100.2 kg)   SpO2 98%   BMI 35.67 kg/m    BP Readings from Last 3 Encounters:  05/12/18 124/89  04/14/18 119/80  11/21/17 119/73     Wt Readings from Last 3 Encounters:  05/12/18 221 lb (100.2 kg)  04/14/18 221 lb  (100.2 kg)  11/21/17 220 lb (99.8 kg)     Physical Exam Pt is alert and oriented PERRL EOMI HEART IS RRR no murmur or rub LCTA no wheezing or rales MUSCULOSKELETAL feels some paraspinous muscle tenderness but no overt trigger points.  He does have a positive straight leg raise on the left side.  He is ambulating with a mildly antalgic gait but his muscle tone and bulk is good  Labs  No results found for: HGBA1C Lab Results  Component Value Date   CREATININE 0.67 (L) 04/04/2017    -------------------------------------------------------------------------------------------------------------------- Lab Results  Component Value Date   WBC 8.2 03/13/2013   HGB 15.0 03/13/2013   HCT 43.6 03/13/2013   PLT 240 03/13/2013   GLUCOSE 95 04/04/2017   NA 140 04/04/2017   K 4.5 04/04/2017   CL 102 04/04/2017   CREATININE 0.67 (L) 04/04/2017   BUN 14 04/04/2017   CO2 23 04/04/2017   TSH 0.538 03/13/2013   INR 0.9 04/03/2012    --------------------------------------------------------------------------------------------------------------------- Dg C-arm 1-60 Min-no Report  Result Date: 05/12/2018 Fluoroscopy was utilized by the requesting physician.  No radiographic interpretation.     Assessment & Plan:   Loraine LericheMark was seen today for hip pain.  Diagnoses and all orders for this visit:  DDD (degenerative disc disease), lumbar -     Lumbar Epidural Injection  Sciatica of left side -     Lumbar Epidural Injection  Lumbosacral spondylosis without myelopathy -     Lumbar Epidural Injection  Spinal stenosis of lumbar region with neurogenic claudication -     Lumbar Epidural Injection  Other orders -     triamcinolone acetonide (KENALOG-40) injection 40 mg -     sodium chloride flush (NS) 0.9 % injection 10 mL -     ropivacaine (PF) 2 mg/mL (0.2%) (NAROPIN) injection 10 mL -     midazolam (VERSED) 5 MG/5ML injection 5 mg -     lidocaine (PF) (XYLOCAINE) 1 % injection 5 mL -      lactated ringers infusion 1,000 mL -     iopamidol (ISOVUE-M) 41 % intrathecal injection 20 mL        ----------------------------------------------------------------------------------------------------------------------  Problem List Items Addressed This Visit    None    Visit Diagnoses    DDD (degenerative disc disease), lumbar    -  Primary  Relevant Medications   triamcinolone acetonide (KENALOG-40) injection 40 mg (Completed)   Sciatica of left side       Relevant Medications   midazolam (VERSED) 5 MG/5ML injection 5 mg (Completed)   Lumbosacral spondylosis without myelopathy       Relevant Medications   triamcinolone acetonide (KENALOG-40) injection 40 mg (Completed)   Spinal stenosis of lumbar region with neurogenic claudication       Relevant Medications   triamcinolone acetonide (KENALOG-40) injection 40 mg (Completed)        ----------------------------------------------------------------------------------------------------------------------  1. DDD (degenerative disc disease), lumbar Plan on proceeding with a repeat epidural today however I am going to change the site to an L3-4 approach.  In review of his past injection therapy this may be of more benefit.  Of gone over the risks and benefits of the procedure with him in full detail and all questions have been answered.  We will have him return to clinic in 1 month for reevaluation possible repeat injection at that time.  I also encouraged him to continue with stretching strengthening exercises as reviewed once again today. - Lumbar Epidural Injection  2. Sciatica of left side As above - Lumbar Epidural Injection  3. Lumbosacral spondylosis without myelopathy Above - Lumbar Epidural Injection  4. Spinal stenosis of lumbar region with neurogenic claudication  - Lumbar Epidural  Injection    ----------------------------------------------------------------------------------------------------------------------  I am having Calton P. Landi maintain his metoprolol succinate, multivitamin, Vitamin D3, magnesium, ferrous sulfate, aspirin, co-enzyme Q-10, HYDROcodone-acetaminophen, vitamin B-12, fluticasone, levalbuterol, timolol, EPINEPHrine, and timolol. We administered triamcinolone acetonide, sodium chloride flush, ropivacaine (PF) 2 mg/mL (0.2%), midazolam, lidocaine (PF), lactated ringers, and iopamidol. We will continue to administer triamcinolone acetonide, sodium chloride flush, ropivacaine (PF) 2 mg/mL (0.2%), midazolam, lidocaine (PF), lactated ringers, iopamidol, dexamethasone, ropivacaine (PF) 2 mg/mL (0.2%), triamcinolone acetonide, ropivacaine (PF) 5 mg/mL (0.5%), dexamethasone, and ropivacaine (PF) 2 mg/mL (0.2%).   Meds ordered this encounter  Medications  . triamcinolone acetonide (KENALOG-40) injection 40 mg  . sodium chloride flush (NS) 0.9 % injection 10 mL  . ropivacaine (PF) 2 mg/mL (0.2%) (NAROPIN) injection 10 mL  . midazolam (VERSED) 5 MG/5ML injection 5 mg  . lidocaine (PF) (XYLOCAINE) 1 % injection 5 mL  . lactated ringers infusion 1,000 mL  . iopamidol (ISOVUE-M) 41 % intrathecal injection 20 mL   Patient's Medications  New Prescriptions   No medications on file  Previous Medications   ASPIRIN 325 MG TABLET    Take 325 mg by mouth daily.   CHOLECALCIFEROL (VITAMIN D3) 1000 UNITS CAPS    Take by mouth 3 (three) times daily.   CO-ENZYME Q-10 30 MG CAPSULE    Take 30 mg by mouth daily.   EPINEPHRINE 0.3 MG/0.3 ML IJ SOAJ INJECTION    Inject into the muscle.   FERROUS SULFATE 325 (65 FE) MG TABLET    Take 325 mg by mouth daily with breakfast.   FLUTICASONE (FLONASE) 50 MCG/ACT NASAL SPRAY    Place into the nose.   HYDROCODONE-ACETAMINOPHEN (NORCO) 10-325 MG TABLET    Take 1 tablet by mouth every 6 (six) hours as needed.   LEVALBUTEROL (XOPENEX  HFA) 45 MCG/ACT INHALER    Inhale into the lungs.   MAGNESIUM 30 MG TABLET    Take 30 mg by mouth daily.   METOPROLOL SUCCINATE (TOPROL-XL) 50 MG 24 HR TABLET    Take 50 mg by mouth daily. Take with or immediately following a meal.    MULTIPLE VITAMIN (  MULTIVITAMIN) CAPSULE    Take 1 capsule by mouth daily.   TIMOLOL (TIMOPTIC) 0.5 % OPHTHALMIC SOLUTION       TIMOLOL (TIMOPTIC-XR) 0.5 % OPHTHALMIC GEL-FORMING    instill 1 drop into both eyes once daily   VITAMIN B-12 (CYANOCOBALAMIN) 1000 MCG TABLET    Take by mouth.  Modified Medications   No medications on file  Discontinued Medications   No medications on file   ----------------------------------------------------------------------------------------------------------------------  Follow-up: Return for evaluation, procedure.    Procedure: L3-L4 LESI with fluoroscopic guidance and with moderate sedation  NOTE: The risks, benefits, and expectations of the procedure have been discussed and explained to the patient who was understanding and in agreement with suggested treatment plan. No guarantees were made.  DESCRIPTION OF PROCEDURE: Lumbar epidural steroid injection with 3 mg IV Versed, EKG, blood pressure, pulse, and pulse oximetry monitoring. The procedure was performed with the patient in the prone position under fluoroscopic guidance.  Sterile prep x3 was initiated and I then injected subcutaneous lidocaine to the overlying 304 site after its fluoroscopic identifictation.  Using strict aseptic technique, I then advanced an 18-gauge Tuohy epidural needle in the midline using interlaminar approach via loss-of-resistance to saline technique. There was negative aspiration for heme or  CSF.  I then confirmed position with both AP and Lateral fluoroscan.  2 cc of Isovue were injected and a  total of 5 mL of Preservative-Free normal saline mixed with 40 mg of Kenalog and 1cc Ropicaine 0.2 percent were injected incrementally via the  epidurally  placed needle. The needle was removed. The patient tolerated the injection well and was convalesced and discharged to home in stable condition. Should the patient have any post procedure difficulty they have been instructed on how to contact us for assistance.    Yevette Edwards, MD

## 2018-05-12 NOTE — Progress Notes (Signed)
Safety precautions to be maintained throughout the outpatient stay will include: orient to surroundings, keep bed in low position, maintain call bell within reach at all times, provide assistance with transfer out of bed and ambulation.  

## 2018-05-13 ENCOUNTER — Telehealth: Payer: Self-pay | Admitting: *Deleted

## 2018-05-13 NOTE — Telephone Encounter (Signed)
Attempted to call for post procedure follow-up. Message left. 

## 2018-07-11 ENCOUNTER — Encounter: Payer: Self-pay | Admitting: *Deleted

## 2018-07-11 ENCOUNTER — Emergency Department: Payer: Federal, State, Local not specified - PPO

## 2018-07-11 ENCOUNTER — Emergency Department
Admission: EM | Admit: 2018-07-11 | Discharge: 2018-07-11 | Disposition: A | Discharge status: Home / Self Care | Payer: Federal, State, Local not specified - PPO | Attending: Emergency Medicine | Admitting: Emergency Medicine

## 2018-07-11 ENCOUNTER — Other Ambulatory Visit: Payer: Self-pay

## 2018-07-11 DIAGNOSIS — R079 Chest pain, unspecified: Secondary | ICD-10-CM

## 2018-07-11 DIAGNOSIS — I10 Essential (primary) hypertension: Secondary | ICD-10-CM | POA: Diagnosis not present

## 2018-07-11 DIAGNOSIS — R0789 Other chest pain: Secondary | ICD-10-CM | POA: Diagnosis present

## 2018-07-11 DIAGNOSIS — Z79899 Other long term (current) drug therapy: Secondary | ICD-10-CM | POA: Insufficient documentation

## 2018-07-11 DIAGNOSIS — Z7982 Long term (current) use of aspirin: Secondary | ICD-10-CM | POA: Diagnosis not present

## 2018-07-11 DIAGNOSIS — Z87891 Personal history of nicotine dependence: Secondary | ICD-10-CM | POA: Insufficient documentation

## 2018-07-11 DIAGNOSIS — R42 Dizziness and giddiness: Secondary | ICD-10-CM | POA: Diagnosis not present

## 2018-07-11 DIAGNOSIS — R110 Nausea: Secondary | ICD-10-CM | POA: Diagnosis not present

## 2018-07-11 HISTORY — DX: Unspecified atrial fibrillation: I48.91

## 2018-07-11 HISTORY — DX: Vascular disorder of intestine, unspecified: K55.9

## 2018-07-11 LAB — BASIC METABOLIC PANEL
Anion gap: 4 — ABNORMAL LOW (ref 5–15)
BUN: 13 mg/dL (ref 6–20)
CALCIUM: 8.2 mg/dL — AB (ref 8.9–10.3)
CHLORIDE: 106 mmol/L (ref 98–111)
CO2: 28 mmol/L (ref 22–32)
Creatinine, Ser: 0.73 mg/dL (ref 0.61–1.24)
GFR calc non Af Amer: >60 mL/min (ref >60)
Glucose, Bld: 118 mg/dL — ABNORMAL HIGH (ref 70–99)
Potassium: 3.7 mmol/L (ref 3.5–5.1)
Sodium: 138 mmol/L (ref 135–145)

## 2018-07-11 LAB — CBC
HCT: 41.5 % (ref 39.0–52.0)
HEMOGLOBIN: 13.3 g/dL (ref 13.0–17.0)
MCH: 30.2 pg (ref 26.0–34.0)
MCHC: 32 g/dL (ref 30.0–36.0)
MCV: 94.3 fL (ref 80.0–100.0)
NRBC: 0 % (ref 0.0–0.2)
PLATELETS: 209 10*3/uL (ref 150–400)
RBC: 4.4 MIL/uL (ref 4.22–5.81)
RDW: 12.6 % (ref 11.5–15.5)
WBC: 6.7 10*3/uL (ref 4.0–10.5)

## 2018-07-11 LAB — TROPONIN I
Troponin I: <0.03 ng/mL (ref <0.03)
Troponin I: <0.03 ng/mL (ref <0.03)

## 2018-07-11 MED ORDER — ONDANSETRON HCL 4 MG/2ML IJ SOLN
4.0000 mg | Freq: Once | INTRAMUSCULAR | Status: AC
  Administered 2018-07-11: 4 mg via INTRAVENOUS
  Filled 2018-07-11: qty 2

## 2018-07-11 MED ORDER — SODIUM CHLORIDE 0.9 % IV BOLUS
500.0000 mL | Freq: Once | INTRAVENOUS | Status: AC
  Administered 2018-07-11: 500 mL via INTRAVENOUS

## 2018-07-11 MED ORDER — ASPIRIN 81 MG PO CHEW
324.0000 mg | CHEWABLE_TABLET | Freq: Once | ORAL | Status: AC
  Administered 2018-07-11: 324 mg via ORAL
  Filled 2018-07-11: qty 4

## 2018-07-11 NOTE — ED Triage Notes (Signed)
Per EMS report, patient was sitting watching TV, had a near-syncopal event. Patient c/o chest pain across the top of the chest to left jaw. Patient described pain as burning. Patient has a history of cardiac cath without stents. Per wife's report to EMS, patient was confused and pale after near-syncope. Patient is now at baseline.Patient reports 5lb weight gain over the last 2 days. Patient c/o URI for two days. Patient has a history of afib.

## 2018-07-11 NOTE — ED Provider Notes (Signed)
[facility] Emergency Department Provider Note  Time seen: 8:04 PM  I have reviewed the triage vital signs and the nursing notes.   HISTORY  Chief Complaint Chest Pain    HPI Douglas Parsons is a 53 y.o. male with a past medical history of gastric reflux, hypertension, presents to the emergency department for not feeling well/chest discomfort.  According to the patient around 7 PM tonight he was lying in his chair when he began feeling somewhat lightheaded, states "I just did not feel right", he states shortly afterwards developed a mild burning sensation across the top of his chest.  Family states when they walk again the patient appeared somewhat pale.  Denies diaphoresis.  States he was a little nauseous at the time but denies any currently.  Denies any current chest discomfort but states he continues to "just not feel right."  Patient states he sees Dr. Gwen PoundsKowalski for cardiology, last had a catheterization 11 years ago which was normal per patient.  No stents.   Past Medical History:  Diagnosis Date  . Allergy to alpha-gal   . Atrial fibrillation (HCC)   . GERD (gastroesophageal reflux disease)   . Hypertension   . Ischemic colitis (HCC)     There are no active problems to display for this patient.   Past Surgical History:  Procedure Laterality Date  . CARDIAC CATHETERIZATION    . CARDIAC CATHETERIZATION    . TONSILLECTOMY  1994    Prior to Admission medications   Medication Sig Start Date End Date Taking? Authorizing Provider  aspirin 325 MG tablet Take 325 mg by mouth daily.    [provider]  Cholecalciferol (VITAMIN D3) 1000 UNITS CAPS Take by mouth 3 (three) times daily.    [provider]  co-enzyme Q-10 30 MG capsule Take 30 mg by mouth daily.    [provider]  EPINEPHrine 0.3 mg/0.3 mL IJ SOAJ injection Inject into the muscle. 11/21/17   [provider]  ferrous sulfate 325 (65 FE) MG tablet Take 325 mg by  mouth daily with breakfast.    [provider]  fluticasone (FLONASE) 50 MCG/ACT nasal spray Place into the nose. 06/19/17 06/19/18  [provider]  HYDROcodone-acetaminophen (NORCO) 10-325 MG tablet Take 1 tablet by mouth every 6 (six) hours as needed.    [provider]  levalbuterol Pauline Aus(XOPENEX HFA) 45 MCG/ACT inhaler Inhale into the lungs. 06/30/17 06/30/18  [provider]  magnesium 30 MG tablet Take 30 mg by mouth daily.    [provider]  metoprolol succinate (TOPROL-XL) 50 MG 24 hr tablet Take 50 mg by mouth daily. Take with or immediately following a meal.     [provider]  Multiple Vitamin (MULTIVITAMIN) capsule Take 1 capsule by mouth daily.    [provider]  timolol (TIMOPTIC) 0.5 % ophthalmic solution  05/03/18   [provider]  timolol (TIMOPTIC-XR) 0.5 % ophthalmic gel-forming instill 1 drop into both eyes once daily 01/22/17   [provider]  vitamin B-12 (CYANOCOBALAMIN) 1000 MCG tablet Take by mouth.    [provider]    Allergies  Allergen Reactions  . Alpha-Gal Anaphylaxis  . Atorvastatin Other (See Comments)  . Lovastatin Other (See Comments)  . Rosuvastatin Other (See Comments)  . Simvastatin Other (See Comments)  . Statins Other (See Comments)    Muscle aches    Family History  Problem Relation Age of Onset  . Arthritis Mother   .  COPD Mother   . Hyperlipidemia Mother   . Vision loss Mother   . Cancer Father   . Early death Father   . Heart disease Father   . Hyperlipidemia Father   . Hypertension Father     Social History Social History   Tobacco Use  . Smoking status: Former Smoker    Types: Cigarettes  . Smokeless tobacco: Former Engineer, waterUser  Substance Use Topics  . Alcohol use: Not Currently    Alcohol/week: 0.0 standard drinks  . Drug use: Not on file    Review of Systems Constitutional: Negative for fever. Cardiovascular: Mild upper chest burning, now  resolved Respiratory: Negative for shortness of breath. Gastrointestinal: Negative for abdominal pain, vomiting Musculoskeletal: Negative for leg pain or swelling. Skin: Negative for skin complaints  Neurological: Negative for headache All other ROS negative  ____________________________________________   PHYSICAL EXAM:  VITAL SIGNS: ED Triage Vitals  Enc Vitals Group     BP 07/11/18 1939 (!) 151/92     Pulse Rate 07/11/18 1939 66     Resp 07/11/18 1939 16     Temp 07/11/18 1939 98.7 F (37.1 C)     Temp Source 07/11/18 1939 Oral     SpO2 07/11/18 1939 98 %     Weight 07/11/18 1940 226 lb (102.5 kg)     Height 07/11/18 1940 5\' 6"  (1.676 m)     Head Circumference --      Peak Flow --      Pain Score 07/11/18 1940 1     Pain Loc --      Pain Edu? --      Excl. in GC? --    Constitutional: Alert and oriented. Well appearing and in no distress. Eyes: Normal exam ENT   Head: Normocephalic and atraumatic.   Mouth/Throat: Mucous membranes are moist. Cardiovascular: Normal rate, regular rhythm. No murmur Respiratory: Normal respiratory effort without tachypnea nor retractions. Breath sounds are clear  Gastrointestinal: Soft and nontender. No distention.  Musculoskeletal: Nontender with normal range of motion in all extremities.  Neurologic:  Normal speech and language. No gross focal neurologic deficits  Skin:  Skin is warm, dry and intact.  Psychiatric: Mood and affect are normal.  ____________________________________________    EKG  EKG viewed and interpreted by myself shows a normal sinus rhythm at 71 bpm with a narrow QRS, normal axis, normal intervals, nonspecific ST changes without ST elevation.  ____________________________________________    RADIOLOGY  Chest x-ray is normal  ____________________________________________   INITIAL IMPRESSION / ASSESSMENT AND PLAN / ED COURSE  Pertinent labs & imaging results that were available during my care of the  patient were reviewed by me and considered in my medical decision making (see chart for details).  Patient presents to the emergency department for not feeling well.  States he was sitting in a chair when all of a sudden he began feeling somewhat flushed and lightheaded began feeling a little nauseated and developed a burning sensation across the top of his chest.  States most of his symptoms have resolved at this time besides "not feeling well" which he states is hard to describe but feels somewhat lightheaded.  Differential this time is quite broad but would include metabolic abnormality, ACS, anxiety.  Patient states he has been suffering from an upper respiratory infection for the past 3 days, no significant cough or congestion during my exam.  States he has been taking Alka-Seltzer but takes that fairly regularly without similar symptoms previously.  Patient's labs are largely within normal limits with a negative troponin.  Patient states he is feeling back to normal.  Denies any symptoms at this time.  Patient's repeat troponin remains negative patient continues to appear normal.  We will discharge the patient home with PCP follow-up. ____________________________________________   FINAL CLINICAL IMPRESSION(S) / ED DIAGNOSES  Chest pain Lightheadedness   Minna Antis, MD 07/11/18 2301

## 2018-07-11 NOTE — ED Notes (Signed)
Report given to April RN

## 2018-07-11 NOTE — ED Notes (Signed)
Patient taken to xray.

## 2018-07-14 ENCOUNTER — Other Ambulatory Visit: Payer: Self-pay | Admitting: Internal Medicine

## 2018-07-14 DIAGNOSIS — R55 Syncope and collapse: Secondary | ICD-10-CM

## 2018-07-20 ENCOUNTER — Ambulatory Visit
Admission: RE | Admit: 2018-07-20 | Discharge: 2018-07-20 | Disposition: A | Discharge status: Home / Self Care | Payer: Federal, State, Local not specified - PPO | Source: Ambulatory Visit | Attending: Internal Medicine | Admitting: Internal Medicine

## 2018-07-20 DIAGNOSIS — R55 Syncope and collapse: Secondary | ICD-10-CM | POA: Diagnosis not present

## 2018-08-14 ENCOUNTER — Other Ambulatory Visit: Payer: Self-pay | Admitting: Internal Medicine

## 2018-08-14 DIAGNOSIS — R42 Dizziness and giddiness: Secondary | ICD-10-CM

## 2018-08-14 DIAGNOSIS — R110 Nausea: Secondary | ICD-10-CM

## 2018-08-23 ENCOUNTER — Ambulatory Visit
Admission: RE | Admit: 2018-08-23 | Discharge: 2018-08-23 | Disposition: A | Discharge status: Home / Self Care | Payer: Federal, State, Local not specified - PPO | Source: Ambulatory Visit | Attending: Internal Medicine | Admitting: Internal Medicine

## 2018-08-23 ENCOUNTER — Other Ambulatory Visit: Payer: Self-pay

## 2018-08-23 DIAGNOSIS — R110 Nausea: Secondary | ICD-10-CM | POA: Diagnosis present

## 2018-08-23 DIAGNOSIS — R42 Dizziness and giddiness: Secondary | ICD-10-CM | POA: Diagnosis not present

## 2018-08-28 ENCOUNTER — Other Ambulatory Visit: Payer: Self-pay

## 2018-08-28 ENCOUNTER — Ambulatory Visit (INDEPENDENT_AMBULATORY_CARE_PROVIDER_SITE_OTHER): Payer: Federal, State, Local not specified - PPO | Admitting: Vascular Surgery

## 2018-08-28 ENCOUNTER — Encounter (INDEPENDENT_AMBULATORY_CARE_PROVIDER_SITE_OTHER): Payer: Self-pay | Admitting: Vascular Surgery

## 2018-08-28 VITALS — BP 144/85 | HR 76 | Resp 12 | Ht 66.0 in | Wt 223.0 lb

## 2018-08-28 DIAGNOSIS — I10 Essential (primary) hypertension: Secondary | ICD-10-CM | POA: Diagnosis not present

## 2018-08-28 DIAGNOSIS — I6501 Occlusion and stenosis of right vertebral artery: Secondary | ICD-10-CM | POA: Diagnosis not present

## 2018-08-28 DIAGNOSIS — Z79899 Other long term (current) drug therapy: Secondary | ICD-10-CM

## 2018-08-28 DIAGNOSIS — R42 Dizziness and giddiness: Secondary | ICD-10-CM | POA: Insufficient documentation

## 2018-08-28 DIAGNOSIS — I6509 Occlusion and stenosis of unspecified vertebral artery: Secondary | ICD-10-CM | POA: Insufficient documentation

## 2018-08-28 DIAGNOSIS — I771 Stricture of artery: Secondary | ICD-10-CM

## 2018-08-28 DIAGNOSIS — Z87891 Personal history of nicotine dependence: Secondary | ICD-10-CM

## 2018-08-28 NOTE — Assessment & Plan Note (Signed)
The patient has what sounds like vertigo.  The MRI and CT findings are as listed above.  I would not expect distal vertebral artery stenosis to be a cause of persistent and severe symptoms of dizziness but I guess that is a possibility.  We are limited in what we have in terms of information so I think getting a CT angiogram for further evaluation would be warranted as the MRI the ideal for evaluating perfusion.

## 2018-08-28 NOTE — Assessment & Plan Note (Signed)
MRI which did not show an obvious cause of the symptoms.  There was an incidental finding of at least a suggestion of distal right vertebral artery stenosis.  I have reviewed this study and I think that is an extremely difficult all because this was not an MRA and this is very limited information.  I think a CTA to get more information would be appropriate at this point.  We will see him back after this study.

## 2018-08-28 NOTE — Assessment & Plan Note (Signed)
blood pressure control important in reducing the progression of atherosclerotic disease. On appropriate oral medications.  

## 2018-08-28 NOTE — Progress Notes (Signed)
Patient ID: Douglas Parsons, male   DOB: [birth year], 53 y.o.   MRN: [id number]  Chief Complaint  Patient presents with  . New Patient (Initial Visit)    HPI Douglas Parsons is a 53 y.o. male.  I am asked to see the patient by Dr. Judithann Sheen for evaluation of vertebral artery stenosis.  The patient reports episodes of severe headache and dizziness over the past few months.  These come on with no inciting event or causative factor.  Nothing really makes them better.  The headache is located in the back of the head and is very severe.  He has had a negative head CT which I have independently reviewed and this was followed by an MRI which did not show an obvious cause of the symptoms.  There was an incidental finding of at least a suggestion of distal right vertebral artery stenosis.  I have reviewed this study and I think that is an extremely difficult all because this was not an MRA and this is very limited information.  He reports he had a carotid duplex sometime last year which showed mild disease only.   Past Medical History:  Diagnosis Date  . Allergy to alpha-gal   . Atrial fibrillation (HCC)   . GERD (gastroesophageal reflux disease)   . Hypertension   . Ischemic colitis [facility])     Past Surgical History:  Procedure Laterality Date  . CARDIAC CATHETERIZATION    . CARDIAC CATHETERIZATION    . TONSILLECTOMY  1994    Family History  Problem Relation Age of Onset  . Arthritis Mother   . COPD Mother   . Hyperlipidemia Mother   . Vision loss Mother   . Cancer Father   . Early death Father   . Heart disease Father   . Hyperlipidemia Father   . Hypertension Father     Social History Social History   Tobacco Use  . Smoking status: Former Smoker    Types: Cigarettes    Last attempt to quit: 08/28/1998    Years since quitting: 20.0  . Smokeless tobacco: Former Engineer, water Use Topics  . Alcohol use: Not Currently    Alcohol/week: 0.0 standard drinks  . Drug use: Not Currently     Allergies  Allergen Reactions  . Alpha-Gal Anaphylaxis  . Atorvastatin Other (See Comments)  . Lovastatin Other (See Comments)  . Rosuvastatin Other (See Comments)  . Simvastatin Other (See Comments)  . Statins Other (See Comments)    Muscle aches    Current Outpatient Medications  Medication Sig Dispense Refill  . aspirin 325 MG tablet Take 325 mg by mouth daily.    . Cholecalciferol (VITAMIN D3) 1000 UNITS CAPS Take by mouth 3 (three) times daily.    Marland Kitchen co-enzyme Q-10 30 MG capsule Take 30 mg by mouth daily.    Marland Kitchen EPINEPHrine 0.3 mg/0.3 mL IJ SOAJ injection Inject into the muscle.    . ferrous sulfate 325 (65 FE) MG tablet Take 325 mg by mouth daily with breakfast.    . fluticasone (FLONASE) 50 MCG/ACT nasal spray Place into the nose.    Marland Kitchen HYDROcodone-acetaminophen (NORCO) 10-325 MG tablet Take 1 tablet by mouth every 6 (six) hours as needed.    . levalbuterol (XOPENEX HFA) 45 MCG/ACT inhaler Inhale into the lungs.    . magnesium 30 MG tablet Take 30 mg by mouth daily.    . metoprolol succinate (TOPROL-XL) 50 MG 24 hr tablet Take 50 mg by  mouth daily. Take with or immediately following a meal.     . Multiple Vitamin (MULTIVITAMIN) capsule Take 1 capsule by mouth daily.    . vitamin B-12 (CYANOCOBALAMIN) 1000 MCG tablet Take by mouth.    . timolol (TIMOPTIC) 0.5 % ophthalmic solution     . timolol (TIMOPTIC-XR) 0.5 % ophthalmic gel-forming instill 1 drop into both eyes once daily     Current Facility-Administered Medications  Medication Dose Route Frequency Provider Last Rate Last Dose  . dexamethasone (DECADRON) injection 10 mg  10 mg Other Once Yevette Edwards, MD      . dexamethasone (DECADRON) injection 4 mg  4 mg Other Once Yevette Edwards, MD      . iopamidol (ISOVUE-M) 41 % intrathecal injection 20 mL  20 mL Intrathecal Once PRN Yevette Edwards, MD      . lactated ringers infusion 1,000 mL  1,000 mL Intravenous Continuous Yevette Edwards, MD      . lidocaine (PF) (XYLOCAINE)  1 % injection 5 mL  5 mL Subcutaneous Once Yevette Edwards, MD      . midazolam (VERSED) injection 5 mg  5 mg Intravenous Once Yevette Edwards, MD      . ropivacaine (PF) (NAROPIN) injection 10 mL  10 mL Infiltration Once Yevette Edwards, MD      . ropivacaine (PF) 2 mg/ml (0.2%) (NAROPIN) epidural 10 mL  10 mL Epidural Once Yevette Edwards, MD      . ropivacaine (PF) 2 mg/ml (0.2%) (NAROPIN) epidural 10 mL  10 mL Epidural Once Yevette Edwards, MD      . ropivacaine (PF) 2 mg/mL (0.2%) (NAROPIN) injection 1 mL  1 mL Epidural Once Yevette Edwards, MD      . sodium chloride flush (NS) 0.9 % injection 10 mL  10 mL Other Once Yevette Edwards, MD      . triamcinolone acetonide (KENALOG-40) injection 40 mg  40 mg Other Once Yevette Edwards, MD      . triamcinolone acetonide (KENALOG-40) injection 40 mg  40 mg Other Once Yevette Edwards, MD          REVIEW OF SYSTEMS (Negative unless checked)  Constitutional: [] Weight loss  [] Fever  [] Chills Cardiac: [] Chest pain   [] Chest pressure   [x] Palpitations   [] Shortness of breath when laying flat   [] Shortness of breath at rest   [] Shortness of breath with exertion. Vascular:  [] Pain in legs with walking   [] Pain in legs at rest   [] Pain in legs when laying flat   [] Claudication   [] Pain in feet when walking  [] Pain in feet at rest  [] Pain in feet when laying flat   [] History of DVT   [] Phlebitis   [] Swelling in legs   [] Varicose veins   [] Non-healing ulcers Pulmonary:   [] Uses home oxygen   [] Productive cough   [] Hemoptysis   [] Wheeze  [] COPD   [] Asthma Neurologic:  [x] Dizziness  [] Blackouts   [] Seizures   [] History of stroke   [] History of TIA  [] Aphasia   [] Temporary blindness   [] Dysphagia   [] Weakness or numbness in arms   [] Weakness or numbness in legs X positive for headaches and memory issues Musculoskeletal:  [] Arthritis   [] Joint swelling   [] Joint pain   [] Low back pain Hematologic:  [] Easy bruising  [] Easy bleeding   [] Hypercoagulable state   [] Anemic   [] Hepatitis Gastrointestinal:  [] Blood in stool   [] Vomiting blood  [x] Gastroesophageal  reflux/heartburn   [] Abdominal pain Genitourinary:  [] Chronic kidney disease   [] Difficult urination  [] Frequent urination  [] Burning with urination   [] Hematuria Skin:  [] Rashes   [] Ulcers   [] Wounds Psychological:  [] History of anxiety   []  History of major depression.    Physical Exam BP (!) 144/85 (BP Location: Left Arm, Patient Position: Sitting, Cuff Size: Small)   Pulse 76   Resp 12   Ht 5\' 6"  (1.676 m)   Wt 223 lb (101.2 kg)   BMI 35.99 kg/m  Gen:  WD/WN, NAD Head: [place]/AT, No temporalis wasting.  Ear/Nose/Throat: Hearing grossly intact, nares w/o erythema or drainage, oropharynx w/o Erythema/Exudate Eyes: Conjunctiva clear, sclera non-icteric  Neck: trachea midline.  No bruit or JVD.  Pulmonary:  Good air movement, clear to auscultation bilaterally.  Cardiac: RRR, normal S1, S2 Vascular:  Vessel Right Left  Radial Palpable Palpable                                   Gastrointestinal: soft, non-tender/non-distended. Musculoskeletal: M/S 5/5 throughout.  Extremities without ischemic changes.  No deformity or atrophy. No edema. Neurologic: Sensation grossly intact in extremities.  Symmetrical.  Speech is fluent. Motor exam as listed above. Psychiatric: Judgment intact, Mood & affect appropriate for pt's clinical situation. Dermatologic: No rashes or ulcers noted.  No cellulitis or open wounds.   Radiology Mr Brain Wo Contrast  Result Date: 08/23/2018 CLINICAL DATA:  53 year old male with dizziness, nausea, not feeling well. EXAM: MRI HEAD WITHOUT CONTRAST TECHNIQUE: Multiplanar, multiecho pulse sequences of the brain and surrounding structures were obtained without intravenous contrast. COMPARISON:  Head CT 07/20/2018. FINDINGS: Brain: Cerebral volume appears stable and within normal limits. No restricted diffusion to suggest acute infarction. No midline shift, mass effect, evidence  of mass lesion, ventriculomegaly, extra-axial collection or acute intracranial hemorrhage. Cervicomedullary junction and pituitary are within normal limits. Scattered small mostly subcortical white matter T2 and FLAIR hyperintensities (series 15, image 35). The extent is mild to moderate for age. No cortical encephalomalacia or chronic cerebral blood products identified. Deep gray matter nuclei, brainstem, and cerebellum appear normal. Vascular: Major intracranial vascular flow voids are preserved. Calcified atherosclerosis of the distal right vertebral artery as demonstrated on the prior CT. Skull and upper cervical spine: Negative visible cervical spine. Normal bone marrow signal. Sinuses/Orbits: Normal orbits. Trace paranasal sinus mucosal thickening. Other: Mastoids are clear. Visible internal auditory structures appear normal. Negative orbit and scalp soft tissues. IMPRESSION: 1.  No acute intracranial abnormality. 2. 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 If vertebral insufficiency is possible consider follow-up CTA head and neck with contrast, or alternatively MRA. 3. Mild to moderate for age nonspecific cerebral white matter signal changes, most commonly due to chronic small vessel disease. Electronically Signed   By: Odessa FlemingH  Hall M.D.   On: 08/23/2018 20:09    Labs Recent Results (from the past 2160 hour(s))  Basic metabolic panel     Status: Abnormal   Collection Time: 07/11/18  7:47 PM  Result Value Ref Range   Sodium 138 135 - 145 mmol/L   Potassium 3.7 3.5 - 5.1 mmol/L   Chloride 106 98 - 111 mmol/L   CO2 28 22 - 32 mmol/L   Glucose, Bld 118 (H) 70 - 99 mg/dL   BUN 13 6 - 20 mg/dL   Creatinine, Ser 6.960.73 0.61 - 1.24 mg/dL   Calcium 8.2 (L) 8.9 -  10.3 mg/dL   GFR calc non Af Amer >60 >60 mL/min   GFR calc Af Amer >60 >60 mL/min   Anion gap 4 (L) 5 - 15    Comment: Performed at [facility], [address] [place], [place] [address]  CBC     Status: None    Collection Time: 07/11/18  7:47 PM  Result Value Ref Range   WBC 6.7 4.0 - 10.5 K/uL   RBC 4.40 4.22 - 5.81 MIL/uL   Hemoglobin 13.3 13.0 - 17.0 g/dL   HCT 72.5 36.6 - 44.0 %   MCV 94.3 80.0 - 100.0 fL   MCH 30.2 26.0 - 34.0 pg   MCHC 32.0 30.0 - 36.0 g/dL   RDW 34.7 42.5 - 95.6 %   Platelets 209 150 - 400 K/uL   nRBC 0.0 0.0 - 0.2 %    Comment: Performed at [facility], [address] [place], [place] [address]  Troponin I - ONCE - STAT     Status: None   Collection Time: 07/11/18  7:47 PM  Result Value Ref Range   Troponin I <0.03 <0.03 ng/mL    Comment: Performed at [facility], [address] [place], [place] [address]  Troponin I - Once-Timed     Status: None   Collection Time: 07/11/18 10:27 PM  Result Value Ref Range   Troponin I <0.03 <0.03 ng/mL    Comment: Performed at [facility], [address] [place], [place] [address]    Assessment/Plan:  Essential hypertension blood pressure control important in reducing the progression of atherosclerotic disease. On appropriate oral medications.   Dizziness and giddiness The patient has what sounds like vertigo.  The MRI and CT findings are as listed above.  I would not expect distal vertebral artery stenosis to be a cause of persistent and severe symptoms of dizziness but I guess that is a possibility.  We are limited in what we have in terms of information so I think getting a CT angiogram for further evaluation would be warranted as the MRI the ideal for evaluating perfusion.  Occlusion and stenosis of vertebral artery MRI which did not show an obvious cause of the symptoms.  There was an incidental finding of at least a suggestion of distal right vertebral artery stenosis.  I have reviewed this study and I think that is an extremely difficult all because this was not an MRA and this is very limited information.  I think a CTA to get more information would be appropriate at this point.  We  will see him back after this study.      Festus Barren 08/28/2018, 10:53 AM   This note was created with Dragon medical transcription system.  Any errors from dictation are unintentional.

## 2018-09-07 ENCOUNTER — Ambulatory Visit
Admission: RE | Admit: 2018-09-07 | Discharge: 2018-09-07 | Disposition: A | Discharge status: Home / Self Care | Payer: Federal, State, Local not specified - PPO | Source: Ambulatory Visit | Attending: Vascular Surgery | Admitting: Vascular Surgery

## 2018-09-07 ENCOUNTER — Other Ambulatory Visit: Payer: Self-pay

## 2018-09-07 DIAGNOSIS — I771 Stricture of artery: Secondary | ICD-10-CM | POA: Insufficient documentation

## 2018-09-07 MED ORDER — IOPAMIDOL (ISOVUE-370) INJECTION 76%
75.0000 mL | Freq: Once | INTRAVENOUS | Status: AC | PRN
  Administered 2018-09-07: 75 mL via INTRAVENOUS

## 2018-11-11 ENCOUNTER — Ambulatory Visit
Admission: RE | Admit: 2018-11-11 | Discharge: 2018-11-11 | Disposition: A | Discharge status: Home / Self Care | Payer: Federal, State, Local not specified - PPO | Source: Ambulatory Visit | Attending: Urology | Admitting: Urology

## 2018-11-11 ENCOUNTER — Other Ambulatory Visit: Payer: Self-pay

## 2018-11-11 ENCOUNTER — Telehealth: Payer: Federal, State, Local not specified - PPO | Admitting: Urology

## 2018-11-11 ENCOUNTER — Ambulatory Visit
Admission: RE | Admit: 2018-11-11 | Discharge: 2018-11-11 | Disposition: A | Discharge status: Home / Self Care | Payer: Federal, State, Local not specified - PPO | Attending: Urology | Admitting: Urology

## 2018-11-11 DIAGNOSIS — Z87442 Personal history of urinary calculi: Secondary | ICD-10-CM | POA: Insufficient documentation

## 2018-11-12 ENCOUNTER — Telehealth (INDEPENDENT_AMBULATORY_CARE_PROVIDER_SITE_OTHER): Payer: Federal, State, Local not specified - PPO | Admitting: Urology

## 2018-11-12 DIAGNOSIS — Z87442 Personal history of urinary calculi: Secondary | ICD-10-CM

## 2018-11-12 NOTE — Progress Notes (Signed)
Virtual Visit via Video Note  I connected with Douglas Parsons on 11/12/18 at  9:00 AM EDT by a video enabled telemedicine application and verified that I am speaking with the correct person using two identifiers.  Location: Patient: Home Provider: Office   I discussed the limitations of evaluation and management by telemedicine and the availability of in person appointments. The patient expressed understanding and agreed to proceed.  History of Present Illness: 53 year old male with a history of recurrent stone disease presents for annual follow-up via video secondary to COVID-19 pandemic.  He was seen in October 2018 after passing 3 small stones and a subsequent CT showed a 1 to 2 mm calculus at the UVJ which she subsequently passed.  He was on a cholesterol-lowering agent at the time which had a potential side effect of an elevated uric acid.  He was unable to retrieve the stones so an analysis is not available.  A 24 urine study showed borderline hypocitraturia at 411 mg and a total urine volume of 890 mL.  He continues to maintain good hydration.  He had minimal right flank discomfort 2 weeks ago and thinks he may have passed a small stone.  Denies dysuria, gross hematuria or flank/abdominal/pelvic/scrotal pain.  KUB performed on 11/11/2018 shows no calcifications suspicious for urinary tract stones   Observations/Objective: Alert, in no acute distress  Assessment and Plan: 53 year old male with history of recurrent stone disease.  KUB shows no obvious calculi.  He will continue maintaining good hydration.  Follow Up Instructions: 1 year with a KUB.  Instructed to call earlier for recurrent stone pain.   I discussed the assessment and treatment plan with the patient. The patient was provided an opportunity to ask questions and all were answered. The patient agreed with the plan and demonstrated an understanding of the instructions.   The patient was advised to call back or seek an  in-person evaluation if the symptoms worsen or if the condition fails to improve as anticipated.  I provided 5 minutes of non-face-to-face time during this encounter.   Riki Altes, MD

## 2019-07-20 ENCOUNTER — Other Ambulatory Visit: Payer: Self-pay | Admitting: Physician Assistant

## 2019-07-20 DIAGNOSIS — M5441 Lumbago with sciatica, right side: Secondary | ICD-10-CM

## 2019-07-20 DIAGNOSIS — R202 Paresthesia of skin: Secondary | ICD-10-CM

## 2019-07-23 ENCOUNTER — Other Ambulatory Visit: Payer: Self-pay

## 2019-07-23 ENCOUNTER — Ambulatory Visit
Admission: RE | Admit: 2019-07-23 | Discharge: 2019-07-23 | Disposition: A | Discharge status: Home / Self Care | Payer: Federal, State, Local not specified - PPO | Source: Ambulatory Visit | Attending: Physician Assistant | Admitting: Physician Assistant

## 2019-07-23 DIAGNOSIS — M5441 Lumbago with sciatica, right side: Secondary | ICD-10-CM | POA: Insufficient documentation

## 2019-07-23 DIAGNOSIS — R202 Paresthesia of skin: Secondary | ICD-10-CM

## 2019-08-17 IMAGING — CT CT ABD-PELV W/O CM
2 of 4 series · 17 of 46 positions shown, 19 images · non-contrast
Comparison: 07/20/2013

CLINICAL DATA: Right lower quadrant and flank pain with hematuria

EXAM:
CT ABDOMEN AND PELVIS WITHOUT CONTRAST
TECHNIQUE: Multidetector CT imaging of the abdomen and pelvis was performed
following the standard protocol without IV contrast.

[Series 2: axial st · axial · 0.88mm/px · z∈[-491,-66]mm · 14 of 93 slices shown, 16 images]
[im 4/93  soft-tissue]
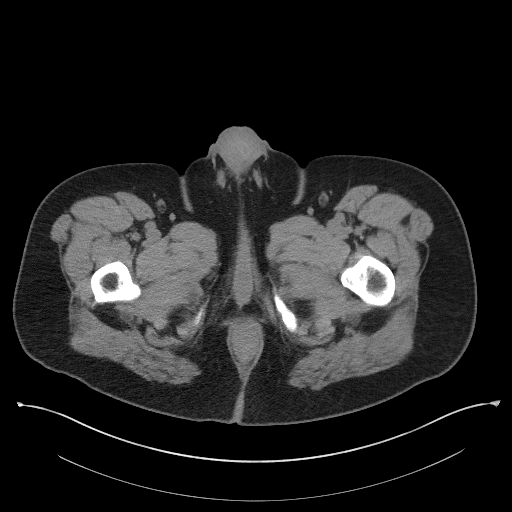
[im 4/93  bone]
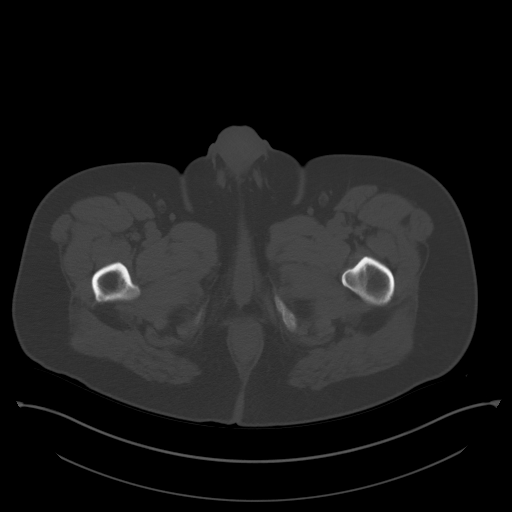
[im 12/93  soft-tissue]
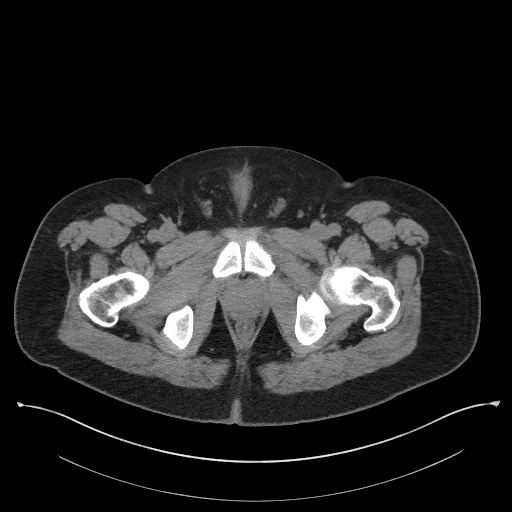
[im 19/93  soft-tissue]
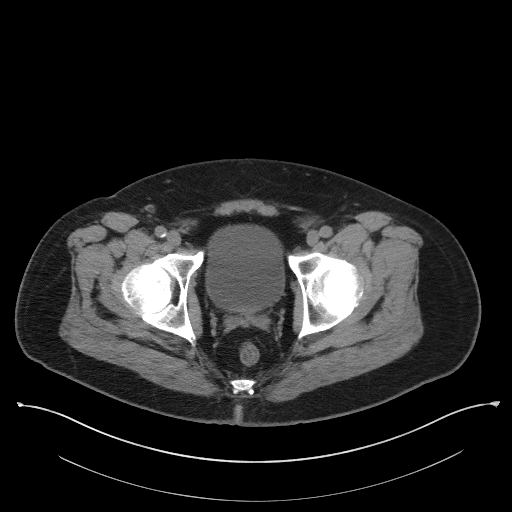
[im 26/93  soft-tissue]
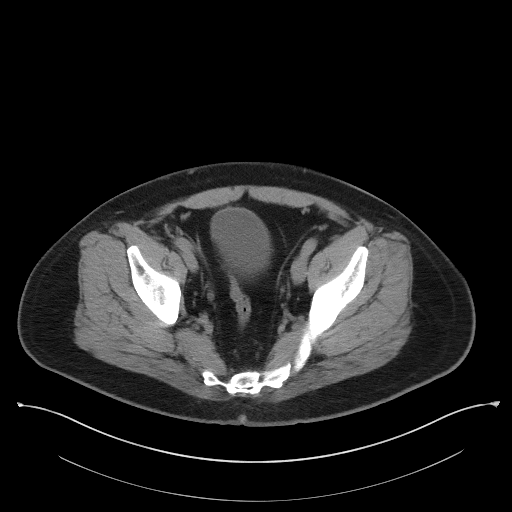
[im 30/93  soft-tissue]
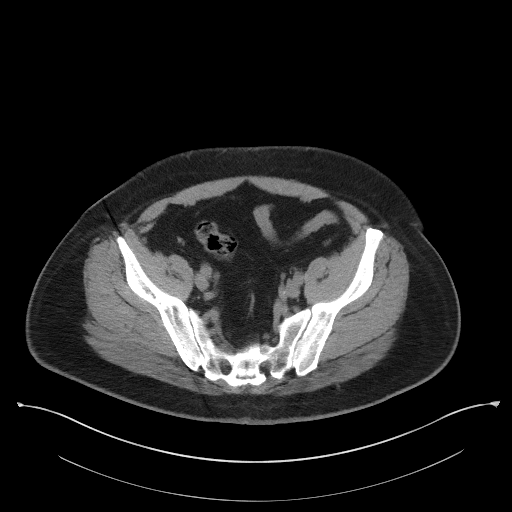
[im 37/93  soft-tissue]
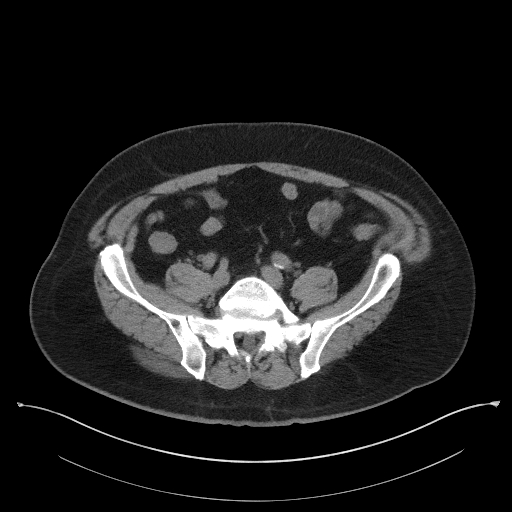
[im 45/93  soft-tissue]
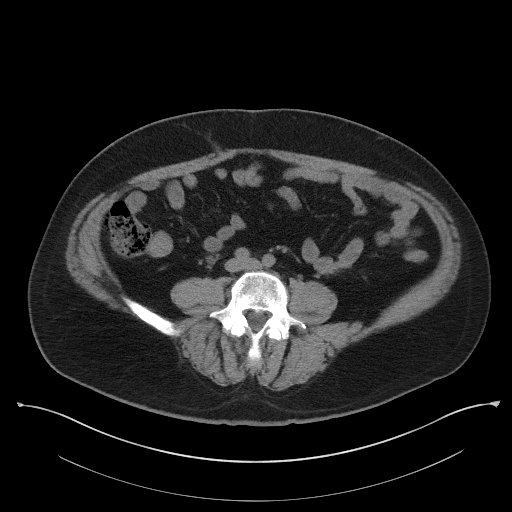
[im 48/93  soft-tissue]
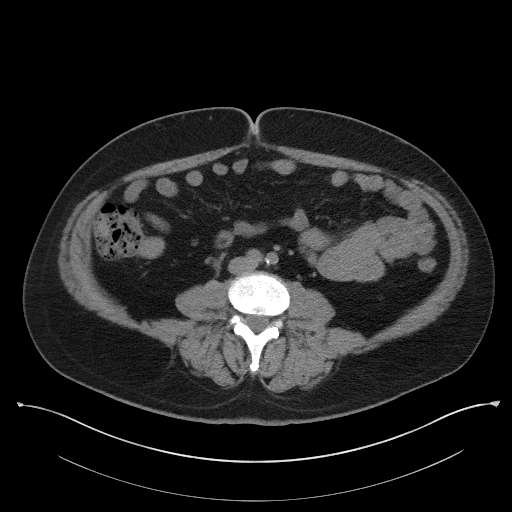
[im 56/93  soft-tissue]
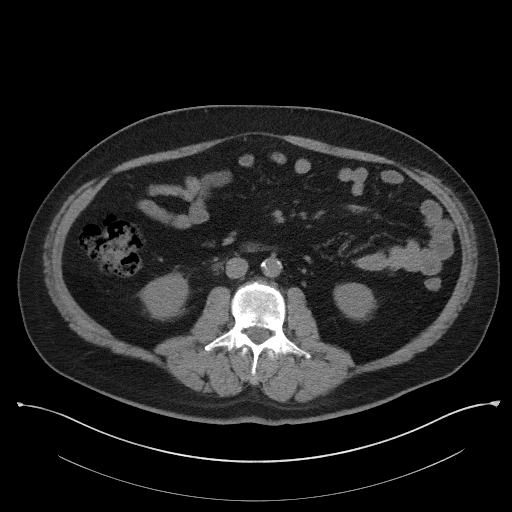
[im 56/93  bone]
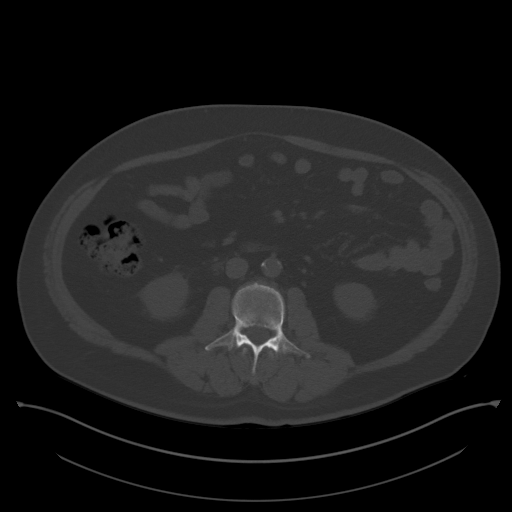
[im 63/93  soft-tissue]
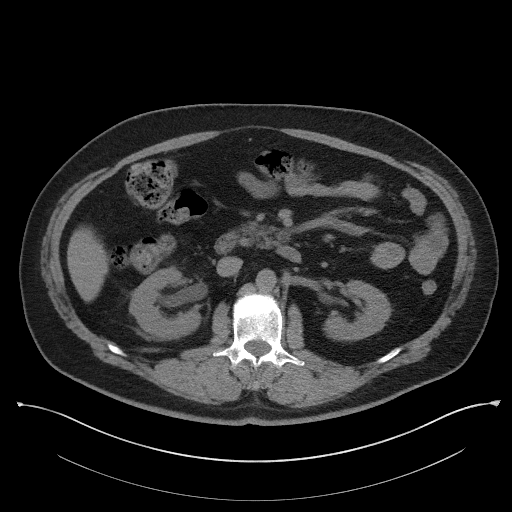
[im 70/93  soft-tissue]
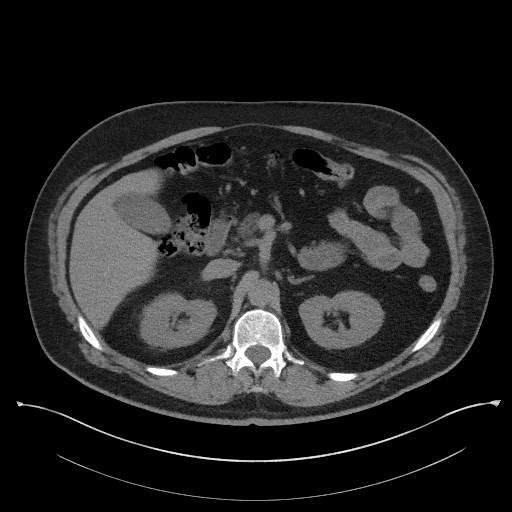
[im 74/93  soft-tissue]
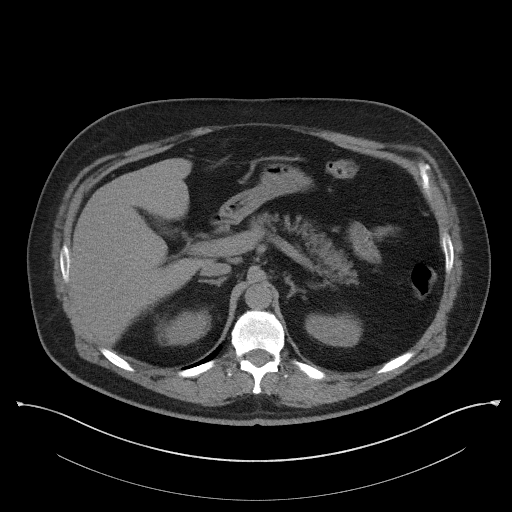
[im 81/93  soft-tissue]
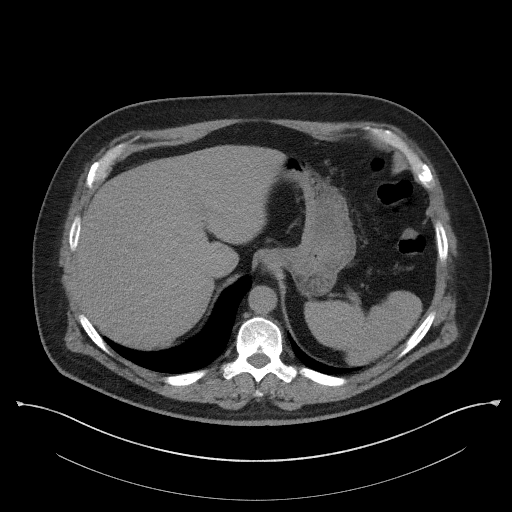
[im 89/93  soft-tissue]
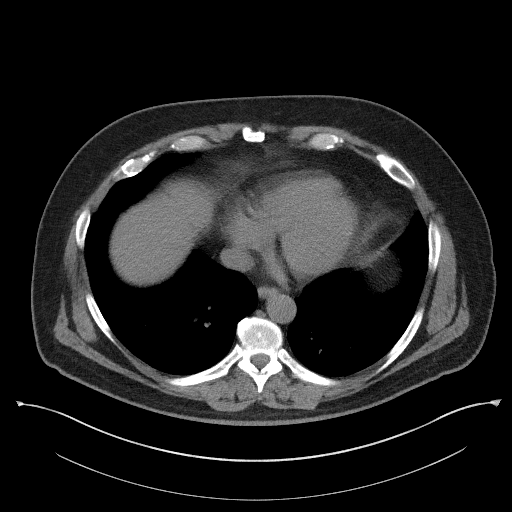

[Series 5: coronal st · coronal · 0.98mm/px · 3 of 99 slices shown]
[im 33/99  soft-tissue]
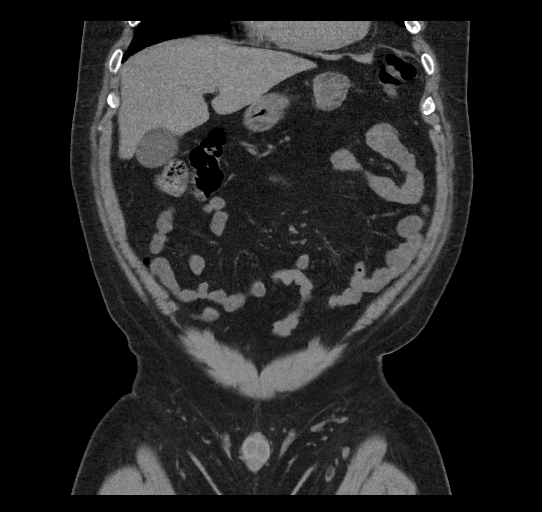
[im 44/99  soft-tissue]
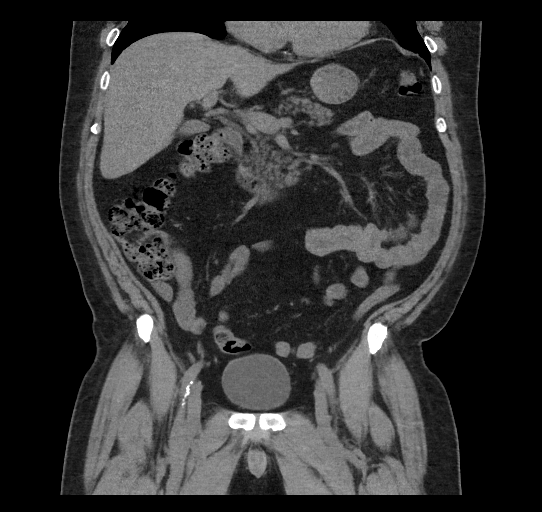
[im 55/99  soft-tissue]
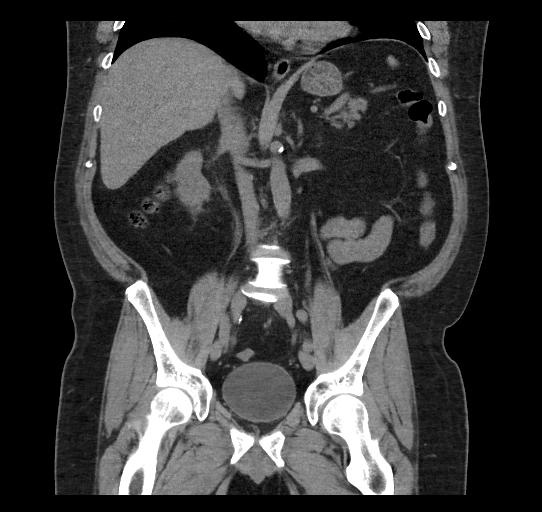

[17 of 46 positions shown; findings below may reference images not displayed]

FINDINGS: Lower chest: Lung bases are clear.  Heart size within normal limits.

Hepatobiliary: No focal liver abnormality is seen. No gallstones,
gallbladder wall thickening, or biliary dilatation.

Pancreas: Unremarkable. No pancreatic ductal dilatation or
surrounding inflammatory changes.

Spleen: Normal in size without focal abnormality.

Adrenals/Urinary Tract: Adrenal glands are within normal limits.
Negative for left hydronephrosis. Moderate right perinephric fat
stranding. Mild right hydronephrosis and hydroureter, secondary to a
punctate 1-2 mm stone at the right UVJ. Probable mild wall
thickening of the right posterior bladder wall. Bladder otherwise
unremarkable

Stomach/Bowel: Stomach is within normal limits. Appendix appears
normal. No evidence of bowel wall thickening, distention, or
inflammatory changes.

Vascular/Lymphatic: Aortic atherosclerosis. No enlarged abdominal or
pelvic lymph nodes.

Reproductive: Prostate is unremarkable.

Other: Negative for free air or free fluid.

Musculoskeletal: Grade 1 anterolisthesis of L5 on S1 with bilateral
pars defect. Degenerative changes
IMPRESSION: 1. Mild right hydronephrosis and hydroureter, secondary to a 1-2 mm
stone at the right UVJ.
2. Negative appendix
3. Stable chronic bilateral pars defect at L5 with grade 1
anterolisthesis of L5 on S1.

## 2019-11-12 ENCOUNTER — Encounter (INDEPENDENT_AMBULATORY_CARE_PROVIDER_SITE_OTHER): Payer: Self-pay

## 2019-11-12 ENCOUNTER — Other Ambulatory Visit: Payer: Self-pay

## 2019-11-12 ENCOUNTER — Ambulatory Visit: Payer: Federal, State, Local not specified - PPO | Admitting: Urology

## 2019-11-12 ENCOUNTER — Encounter: Payer: Self-pay | Admitting: Urology

## 2019-11-12 ENCOUNTER — Ambulatory Visit
Admission: RE | Admit: 2019-11-12 | Discharge: 2019-11-12 | Disposition: A | Discharge status: Home / Self Care | Payer: Federal, State, Local not specified - PPO | Attending: Urology | Admitting: Urology

## 2019-11-12 ENCOUNTER — Ambulatory Visit
Admission: RE | Admit: 2019-11-12 | Discharge: 2019-11-12 | Disposition: A | Discharge status: Home / Self Care | Payer: Federal, State, Local not specified - PPO | Source: Ambulatory Visit | Attending: Urology | Admitting: Urology

## 2019-11-12 VITALS — BP 120/79 | HR 72 | Ht 66.0 in | Wt 213.0 lb

## 2019-11-12 DIAGNOSIS — Z87442 Personal history of urinary calculi: Secondary | ICD-10-CM | POA: Insufficient documentation

## 2019-11-12 DIAGNOSIS — R399 Unspecified symptoms and signs involving the genitourinary system: Secondary | ICD-10-CM

## 2019-11-12 MED ORDER — TAMSULOSIN HCL 0.4 MG PO CAPS: 0.4000 mg | ORAL_CAPSULE | Freq: Every day | ORAL | 3 refills | Status: DC | PRN

## 2019-11-12 NOTE — Progress Notes (Signed)
11/12/2019 9:22 AM   Douglas Parsons Jola Babinski 12-Aug-1965 [id number]  Referring provider: Idelle Crouch, MD [facility] [place],  [place] [address]  Chief Complaint  Patient presents with  . Nephrolithiasis    Urologic history: 1.  Recurrent stone disease -Last stone 2 mm right UVJ calculus 2018 -CT showed no renal calculi  HPI: 54 y.o. male presents for annual follow-up.  -Overall has done well since last visit -Episode of voiding difficulty and suprapubic pain approximately 2 weeks ago which resolved after he started taking tamsulosin. -He does note intermittent decreased force and caliber of his urinary stream which improves with tamsulosin -Denies dysuria, gross hematuria -Denies flank pain -PSA checked annually by PCP and was 1.1 12/2018   PMH: Past Medical History:  Diagnosis Date  . Allergy to alpha-gal   . Atrial fibrillation ([place])   . GERD (gastroesophageal reflux disease)   . Hypertension   . Ischemic colitis [facility])     Surgical History: Past Surgical History:  Procedure Laterality Date  . CARDIAC CATHETERIZATION    . CARDIAC CATHETERIZATION    . TONSILLECTOMY  1994    Home Medications:  Allergies as of 11/12/2019      Reactions   Alpha-gal Anaphylaxis   Atorvastatin Other (See Comments)   Lovastatin Other (See Comments)   Rosuvastatin Other (See Comments)   Simvastatin Other (See Comments)   Statins Other (See Comments)   Muscle aches      Medication List       Accurate as of November 12, 2019  9:22 AM. If you have any questions, ask your nurse or doctor.        aspirin 325 MG tablet Take 325 mg by mouth daily.   clonazePAM 0.5 MG tablet Commonly known as: KLONOPIN Take 0.5 mg by mouth 2 (two) times daily as needed.   co-enzyme Q-10 30 MG capsule Take 30 mg by mouth daily.   EPINEPHrine 0.3 mg/0.3 mL Soaj injection Commonly known as: EPI-PEN Inject into the muscle.   ferrous sulfate 325 (65 FE) MG tablet Take 325  mg by mouth daily with breakfast.   fluticasone 50 MCG/ACT nasal spray Commonly known as: FLONASE Place into the nose.   HYDROcodone-acetaminophen 10-325 MG tablet Commonly known as: NORCO Take 1 tablet by mouth every 6 (six) hours as needed.   levalbuterol 45 MCG/ACT inhaler Commonly known as: XOPENEX HFA Inhale into the lungs.   magnesium 30 MG tablet Take 30 mg by mouth daily.   metoprolol succinate 50 MG 24 hr tablet Commonly known as: TOPROL-XL Take 50 mg by mouth daily. Take with or immediately following a meal.   multivitamin capsule Take 1 capsule by mouth daily.   timolol 0.5 % ophthalmic gel-forming Commonly known as: TIMOPTIC-XR instill 1 drop into both eyes once daily   timolol 0.5 % ophthalmic solution Commonly known as: TIMOPTIC   vitamin B-12 1000 MCG tablet Commonly known as: CYANOCOBALAMIN Take by mouth.   Vitamin D3 25 MCG (1000 UT) Caps Take by mouth 3 (three) times daily.       Allergies:  Allergies  Allergen Reactions  . Alpha-Gal Anaphylaxis  . Atorvastatin Other (See Comments)  . Lovastatin Other (See Comments)  . Rosuvastatin Other (See Comments)  . Simvastatin Other (See Comments)  . Statins Other (See Comments)    Muscle aches    Family History: Family History  Problem Relation Age of Onset  . Arthritis Mother   . COPD Mother   .  Hyperlipidemia Mother   . Vision loss Mother   . Cancer Father   . Early death Father   . Heart disease Father   . Hyperlipidemia Father   . Hypertension Father     Social History:  reports that he quit smoking about 21 years ago. His smoking use included cigarettes. He has quit using smokeless tobacco. He reports previous alcohol use. He reports previous drug use.   Physical Exam: BP 120/79   Pulse 72   Ht 5\' 6"  (1.676 m)   Wt 213 lb (96.6 kg)   BMI 34.38 kg/m   Constitutional:  Alert and oriented, No acute distress. HEENT: [place] AT, moist mucus membranes.  Trachea midline, no  masses. Cardiovascular: No clubbing, cyanosis, or edema. Respiratory: Normal respiratory effort, no increased work of breathing. Skin: No rashes, bruises or suspicious lesions. Neurologic: Grossly intact, no focal deficits, moving all 4 extremities. Psychiatric: Normal mood and affect.   Assessment & Plan:    1.  History recurrent stone disease -Stable -KUB today and he will be notified with the results -Continue annual follow-up  2.  Lower urinary tract symptoms -New problem -Primarily obstructive symptoms which resolved on tamsulosin -We discussed starting tamsulosin daily however he would prefer to take as needed.  Rx tamsulosin sent to pharmacy   , MD  [facility] [address] [place], [place] [phone]

## 2019-11-15 ENCOUNTER — Telehealth: Payer: Self-pay | Admitting: *Deleted

## 2019-11-15 NOTE — Telephone Encounter (Signed)
Left message on phone

## 2019-11-15 NOTE — Telephone Encounter (Signed)
-----   Message from Riki Altes, MD sent at 11/15/2019 10:49 AM EDT ----- KUB showed no definite urinary tract stones

## 2020-03-22 ENCOUNTER — Other Ambulatory Visit: Payer: Self-pay | Admitting: Urology

## 2020-11-10 ENCOUNTER — Ambulatory Visit: Payer: Self-pay | Admitting: Urology

## 2020-11-14 ENCOUNTER — Other Ambulatory Visit: Payer: Self-pay | Admitting: *Deleted

## 2020-11-14 DIAGNOSIS — N200 Calculus of kidney: Secondary | ICD-10-CM

## 2020-11-14 DIAGNOSIS — R399 Unspecified symptoms and signs involving the genitourinary system: Secondary | ICD-10-CM

## 2020-11-15 ENCOUNTER — Ambulatory Visit: Payer: Federal, State, Local not specified - PPO | Admitting: Urology

## 2020-11-15 ENCOUNTER — Other Ambulatory Visit: Payer: Self-pay

## 2020-11-15 ENCOUNTER — Encounter: Payer: Self-pay | Admitting: Urology

## 2020-11-15 VITALS — BP 109/70 | HR 66 | Ht 66.0 in | Wt 230.0 lb

## 2020-11-15 DIAGNOSIS — N401 Enlarged prostate with lower urinary tract symptoms: Secondary | ICD-10-CM

## 2020-11-15 DIAGNOSIS — R310 Gross hematuria: Secondary | ICD-10-CM

## 2020-11-15 DIAGNOSIS — Z87442 Personal history of urinary calculi: Secondary | ICD-10-CM

## 2020-11-15 LAB — MICROSCOPIC EXAMINATION: Bacteria, UA: NONE SEEN

## 2020-11-15 LAB — URINALYSIS, COMPLETE
Bilirubin, UA: NEGATIVE
Glucose, UA: NEGATIVE
Ketones, UA: NEGATIVE
Leukocytes,UA: NEGATIVE
Nitrite, UA: NEGATIVE
Protein,UA: NEGATIVE
RBC, UA: NEGATIVE
Specific Gravity, UA: 1.015 (ref 1.005–1.030)
Urobilinogen, Ur: 0.2 mg/dL (ref 0.2–1.0)
pH, UA: 6 (ref 5.0–7.5)

## 2020-11-15 NOTE — Progress Notes (Signed)
11/15/2020 7:42 AM   Douglas Parsons 08/18/1965 [id number]  Referring provider: Marguarite Arbour, MD [address] [facility] [place],  [place] [address]  Chief Complaint  Patient presents with   Nephrolithiasis    Urologic history: 1.  Recurrent stone disease -Last stone 2 mm right UVJ calculus 2018 -CT showed no renal calculi  2.  BPH with lower urinary tract symptoms -Tamsulosin 0.4 mg daily   HPI: 55 y.o. male presents for annual follow-up.  Since last years visit he denies renal colic or stone symptoms Approximately 3 weeks ago he slipped off a ladder and fell approximately 3 feet falling on his back with his left flank hitting a 5 gallon paint bucket he had bruising of the back and flank and has had intermittent gross hematuria for the past 3 weeks Since last years visit he has been placed on Plavix for TIA Went to urgent care 11/09/2020 and states he was held to have a probable rib fracture Voiding pattern has been stable with decreased stream and urinary frequency.  He was taking the tamsulosin as needed but states he is now taking regularly PSA July 2021 0.93   PMH: Past Medical History:  Diagnosis Date   Allergy to alpha-gal    Atrial fibrillation (HCC)    GERD (gastroesophageal reflux disease)    Hypertension    Ischemic colitis [facility])     Surgical History: Past Surgical History:  Procedure Laterality Date   CARDIAC CATHETERIZATION     CARDIAC CATHETERIZATION     TONSILLECTOMY  1994    Home Medications:  Allergies as of 11/15/2020       Reactions   Alpha-gal Anaphylaxis   Atorvastatin Other (See Comments)   Lovastatin Other (See Comments)   Rosuvastatin Other (See Comments)   Simvastatin Other (See Comments)   Statins Other (See Comments)   Muscle aches        Medication List        Accurate as of November 15, 2020 11:59 PM. If you have any questions, ask your nurse or doctor.          STOP taking these medications     co-enzyme Q-10 30 MG capsule Stopped by: Riki Altes, MD   fluticasone 50 MCG/ACT nasal spray Commonly known as: FLONASE Stopped by: Riki Altes, MD   levalbuterol 45 MCG/ACT inhaler Commonly known as: XOPENEX HFA Stopped by: Riki Altes, MD       TAKE these medications    aspirin EC 81 MG tablet Take 81 mg by mouth daily. Swallow whole. What changed: Another medication with the same name was removed. Continue taking this medication, and follow the directions you see here. Changed by: Riki Altes, MD   clonazePAM 0.5 MG tablet Commonly known as: KLONOPIN Take 0.5 mg by mouth 2 (two) times daily as needed.   clopidogrel 75 MG tablet Commonly known as: PLAVIX Take 1 tablet by mouth daily.   cyclobenzaprine 10 MG tablet Commonly known as: FLEXERIL TAKE 1/2 TO 1 TABLET BY MOUTH AT BEDTIMEAS NEEDED   EPINEPHrine 0.3 mg/0.3 mL Soaj injection Commonly known as: EPI-PEN Inject into the muscle.   ferrous sulfate 325 (65 FE) MG tablet Take 325 mg by mouth daily with breakfast.   HYDROcodone-acetaminophen 10-325 MG tablet Commonly known as: NORCO Take 1 tablet by mouth every 6 (six) hours as needed.   magnesium 30 MG tablet Take 30 mg by mouth daily.   meclizine 25 MG  tablet Commonly known as: ANTIVERT Take 1 tablet by mouth 3 (three) times daily as needed.   metoprolol succinate 50 MG 24 hr tablet Commonly known as: TOPROL-XL Take 50 mg by mouth daily. Take with or immediately following a meal.   multivitamin capsule Take 1 capsule by mouth daily.   tamsulosin 0.4 MG Caps capsule Commonly known as: FLOMAX TAKE 1 CAPSULE BY MOUTH ONCE DAILY 30 MINUTES AFTER LARGEST MEAL.   timolol 0.5 % ophthalmic gel-forming Commonly known as: TIMOPTIC-XR instill 1 drop into both eyes once daily What changed: Another medication with the same name was removed. Continue taking this medication, and follow the directions you see here. Changed by: Riki Altes, MD   vitamin B-12 1000 MCG tablet Commonly known as: CYANOCOBALAMIN Take by mouth.   Vitamin D3 25 MCG (1000 UT) Caps Take by mouth 3 (three) times daily.        Allergies:  Allergies  Allergen Reactions   Alpha-Gal Anaphylaxis   Atorvastatin Other (See Comments)   Lovastatin Other (See Comments)   Rosuvastatin Other (See Comments)   Simvastatin Other (See Comments)   Statins Other (See Comments)    Muscle aches    Family History: Family History  Problem Relation Age of Onset   Arthritis Mother    COPD Mother    Hyperlipidemia Mother    Vision loss Mother    Cancer Father    Early death Father    Heart disease Father    Hyperlipidemia Father    Hypertension Father     Social History:  reports that he quit smoking about 22 years ago. His smoking use included cigarettes. He has quit using smokeless tobacco. He reports previous alcohol use. He reports previous drug use.   Physical Exam: BP 109/70   Pulse 66   Ht 5\' 6"  (1.676 m)   Wt 230 lb (104.3 kg)   BMI 37.12 kg/m   Constitutional:  Alert and oriented, No acute distress. HEENT: [place] AT, moist mucus membranes.  Trachea midline, no masses. Cardiovascular: No clubbing, cyanosis, or edema. Respiratory: Normal respiratory effort, no increased work of breathing. GI: Abdomen is soft, nontender, nondistended, no abdominal masses GU: Resolving ecchymosis over left back/flank region Skin: No rashes, bruises or suspicious lesions. Neurologic: Grossly intact, no focal deficits, moving all 4 extremities. Psychiatric: Normal mood and affect.   Assessment & Plan:    1.  Left flank trauma Intermittent hematuria Since injury 3 weeks ago will order noncontrast CT abdomen and pelvis secondary to contrast shortage  2.  Personal history urinary calculi Will assess with CT as above  3.  BPH with LUTS Stable on tamsulosin   , MD  [facility] [address], [address] [place], [place] [phone]

## 2020-11-16 ENCOUNTER — Encounter: Payer: Self-pay | Admitting: Urology

## 2020-11-16 DIAGNOSIS — N401 Enlarged prostate with lower urinary tract symptoms: Secondary | ICD-10-CM | POA: Insufficient documentation

## 2020-11-22 ENCOUNTER — Telehealth: Payer: Self-pay | Admitting: Urology

## 2020-11-22 DIAGNOSIS — N200 Calculus of kidney: Secondary | ICD-10-CM

## 2020-11-22 DIAGNOSIS — R310 Gross hematuria: Secondary | ICD-10-CM

## 2020-11-22 NOTE — Telephone Encounter (Signed)
Order was entered 

## 2020-11-22 NOTE — Telephone Encounter (Signed)
Pt called following up on CT appt, upon review, there are no orders for CT Please advise.

## 2020-12-06 ENCOUNTER — Ambulatory Visit
Admission: RE | Admit: 2020-12-06 | Discharge: 2020-12-06 | Disposition: A | Discharge status: Home / Self Care | Payer: Federal, State, Local not specified - PPO | Source: Ambulatory Visit | Attending: Urology | Admitting: Urology

## 2020-12-06 ENCOUNTER — Other Ambulatory Visit: Payer: Self-pay

## 2020-12-06 DIAGNOSIS — N200 Calculus of kidney: Secondary | ICD-10-CM | POA: Insufficient documentation

## 2020-12-06 DIAGNOSIS — R310 Gross hematuria: Secondary | ICD-10-CM | POA: Diagnosis present

## 2020-12-12 ENCOUNTER — Telehealth: Payer: Self-pay | Admitting: *Deleted

## 2020-12-12 NOTE — Telephone Encounter (Signed)
-----   Message from Riki Altes, MD sent at 12/07/2020  6:07 PM EDT ----- CT showed no renal injury.  Nonobstructing right renal calculus.  There were healing fractures of the left 11th and 12th ribs.

## 2020-12-12 NOTE — Telephone Encounter (Signed)
Spoke with patient and advised results   

## 2020-12-13 ENCOUNTER — Other Ambulatory Visit: Payer: Self-pay | Admitting: Urology

## 2021-03-10 ENCOUNTER — Other Ambulatory Visit: Payer: Self-pay | Admitting: Urology

## 2021-07-15 ENCOUNTER — Other Ambulatory Visit: Payer: Self-pay | Admitting: Urology

## 2021-10-17 ENCOUNTER — Other Ambulatory Visit: Payer: Self-pay | Admitting: Urology

## 2021-10-18 ENCOUNTER — Other Ambulatory Visit: Payer: Self-pay | Admitting: Urology

## 2021-11-08 ENCOUNTER — Encounter: Payer: Self-pay | Admitting: Urology

## 2021-11-08 ENCOUNTER — Ambulatory Visit
Admission: RE | Admit: 2021-11-08 | Discharge: 2021-11-08 | Disposition: A | Discharge status: Home / Self Care | Payer: Federal, State, Local not specified - PPO | Attending: Urology | Admitting: Urology

## 2021-11-08 ENCOUNTER — Ambulatory Visit: Payer: Federal, State, Local not specified - PPO | Admitting: Urology

## 2021-11-08 ENCOUNTER — Ambulatory Visit
Admission: RE | Admit: 2021-11-08 | Discharge: 2021-11-08 | Disposition: A | Discharge status: Home / Self Care | Payer: Federal, State, Local not specified - PPO | Source: Ambulatory Visit | Attending: Urology | Admitting: Urology

## 2021-11-08 VITALS — BP 123/71 | HR 67 | Ht 66.0 in | Wt 224.0 lb

## 2021-11-08 DIAGNOSIS — N200 Calculus of kidney: Secondary | ICD-10-CM | POA: Insufficient documentation

## 2021-11-08 DIAGNOSIS — N401 Enlarged prostate with lower urinary tract symptoms: Secondary | ICD-10-CM | POA: Diagnosis not present

## 2021-11-08 LAB — URINALYSIS, COMPLETE
Bilirubin, UA: NEGATIVE
Glucose, UA: NEGATIVE
Ketones, UA: NEGATIVE
Leukocytes,UA: NEGATIVE
Nitrite, UA: NEGATIVE
Protein,UA: NEGATIVE
RBC, UA: NEGATIVE
Specific Gravity, UA: 1.025 (ref 1.005–1.030)
Urobilinogen, Ur: 0.2 mg/dL (ref 0.2–1.0)
pH, UA: 6.5 (ref 5.0–7.5)

## 2021-11-08 LAB — MICROSCOPIC EXAMINATION: Bacteria, UA: NONE SEEN

## 2021-11-08 LAB — BLADDER SCAN AMB NON-IMAGING: Scan Result: 1

## 2021-11-08 MED ORDER — TAMSULOSIN HCL 0.4 MG PO CAPS: ORAL_CAPSULE | ORAL | 3 refills | Status: DC

## 2021-11-08 NOTE — Progress Notes (Addendum)
11/08/2021 9:24 AM   Douglas Parsons 07/19/1965 [id number]  Referring provider: Marguarite Arbour, MD [address] [facility] [place],  [place] [address]  Chief Complaint  Patient presents with   Benign Prostatic Hypertrophy    Urologic history: 1.  Recurrent stone disease -Last stone 2 mm right UVJ calculus 2018 -CT showed no renal calculi  2.  BPH with lower urinary tract symptoms -Tamsulosin 0.4 mg daily   HPI: 56 y.o. male presents for annual follow-up.  Doing well since last years visit Passed a small stone ~ 1 month ago and currently asymptomatic CT June 2022 with nonobstructive right renal calculi Remains on tamsulosin and has no bothersome lower urinary tract symptoms PSA July 2022 1.06   PMH: Past Medical History:  Diagnosis Date   Allergy to alpha-gal    Atrial fibrillation (HCC)    GERD (gastroesophageal reflux disease)    Hypertension    Ischemic colitis [facility])     Surgical History: Past Surgical History:  Procedure Laterality Date   CARDIAC CATHETERIZATION     CARDIAC CATHETERIZATION     TONSILLECTOMY  1994    Home Medications:  Allergies as of 11/08/2021       Reactions   Alpha-gal Anaphylaxis   Atorvastatin Other (See Comments)   Lovastatin Other (See Comments)   Rosuvastatin Other (See Comments)   Simvastatin Other (See Comments)   Statins Other (See Comments)   Muscle aches        Medication List        Accurate as of November 08, 2021  9:24 AM. If you have any questions, ask your nurse or doctor.          aspirin EC 81 MG tablet Take 81 mg by mouth daily. Swallow whole.   clonazePAM 0.5 MG tablet Commonly known as: KLONOPIN Take 0.5 mg by mouth 2 (two) times daily as needed.   clopidogrel 75 MG tablet Commonly known as: PLAVIX Take 1 tablet by mouth daily.   cyclobenzaprine 10 MG tablet Commonly known as: FLEXERIL TAKE 1/2 TO 1 TABLET BY MOUTH AT BEDTIMEAS NEEDED   EPINEPHrine 0.3 mg/0.3 mL Soaj  injection Commonly known as: EPI-PEN Inject into the muscle.   ferrous sulfate 325 (65 FE) MG tablet Take 325 mg by mouth daily with breakfast.   HYDROcodone-acetaminophen 10-325 MG tablet Commonly known as: NORCO Take 1 tablet by mouth every 6 (six) hours as needed.   magnesium 30 MG tablet Take 30 mg by mouth daily.   meclizine 25 MG tablet Commonly known as: ANTIVERT Take 1 tablet by mouth 3 (three) times daily as needed.   metoprolol succinate 50 MG 24 hr tablet Commonly known as: TOPROL-XL Take 50 mg by mouth daily. Take with or immediately following a meal.   multivitamin capsule Take 1 capsule by mouth daily.   Repatha SureClick 140 MG/ML Soaj Generic drug: Evolocumab Inject into the skin.   tamsulosin 0.4 MG Caps capsule Commonly known as: FLOMAX TAKE 1 CAPSULE BY MOUTH ONCE DAILY 30 MINUTES AFTER LARGEST MEAL.   timolol 0.5 % ophthalmic gel-forming Commonly known as: TIMOPTIC-XR instill 1 drop into both eyes once daily   vitamin B-12 1000 MCG tablet Commonly known as: CYANOCOBALAMIN Take by mouth.   Vitamin D3 25 MCG (1000 UT) Caps Take by mouth 3 (three) times daily.        Allergies:  Allergies  Allergen Reactions   Alpha-Gal Anaphylaxis   Atorvastatin Other (See Comments)   Lovastatin  Other (See Comments)   Rosuvastatin Other (See Comments)   Simvastatin Other (See Comments)   Statins Other (See Comments)    Muscle aches    Family History: Family History  Problem Relation Age of Onset   Arthritis Mother    COPD Mother    Hyperlipidemia Mother    Vision loss Mother    Cancer Father    Early death Father    Heart disease Father    Hyperlipidemia Father    Hypertension Father     Social History:  reports that he quit smoking about 23 years ago. His smoking use included cigarettes. He has quit using smokeless tobacco. He reports that he does not currently use alcohol. He reports that he does not currently use drugs.   Physical  Exam: BP 123/71   Pulse 67   Ht 5\' 6"  (1.676 m)   Wt 224 lb (101.6 kg)   BMI 36.15 kg/m   Constitutional:  Alert and oriented, No acute distress. HEENT: [place] AT, moist mucus membranes.  Trachea midline, no masses. Cardiovascular: No clubbing, cyanosis, or edema. Respiratory: Normal respiratory effort, no increased work of breathing. GI: Abdomen is soft, nontender, nondistended, no abdominal masses GU: Resolving ecchymosis over left back/flank region Skin: No rashes, bruises or suspicious lesions. Neurologic: Grossly intact, no focal deficits, moving all 4 extremities. Psychiatric: Normal mood and affect.   Assessment & Plan:    1.  BPH with LUTS Stable lower urinary tract symptoms on tamsulosin PVR today 1 mL Tamsulosin refilled Continue annual follow-up  2.  Right nephrolithiasis CT images June 2022 were personally reviewed and examined.  A single small right renal calculus was noted and he recently passed a calculus KUB obtained today was reviewed and there are no calcifications identified over the right renal outlines though there is a moderate amount of stool and bowel gas obscuring the renal outlines    July 2022, MD  [facility] [address], [address] [place], [place] [phone]

## 2021-11-09 ENCOUNTER — Encounter: Payer: Self-pay | Admitting: Urology

## 2022-03-29 IMAGING — CR DG ABDOMEN 1V
2 series · 2 of 2 positions shown · non-contrast
Comparison: 11/11/2018

CLINICAL DATA: History of kidney stones

EXAM:
ABDOMEN - 1 VIEW

[abdomen kub (1 of 2)]
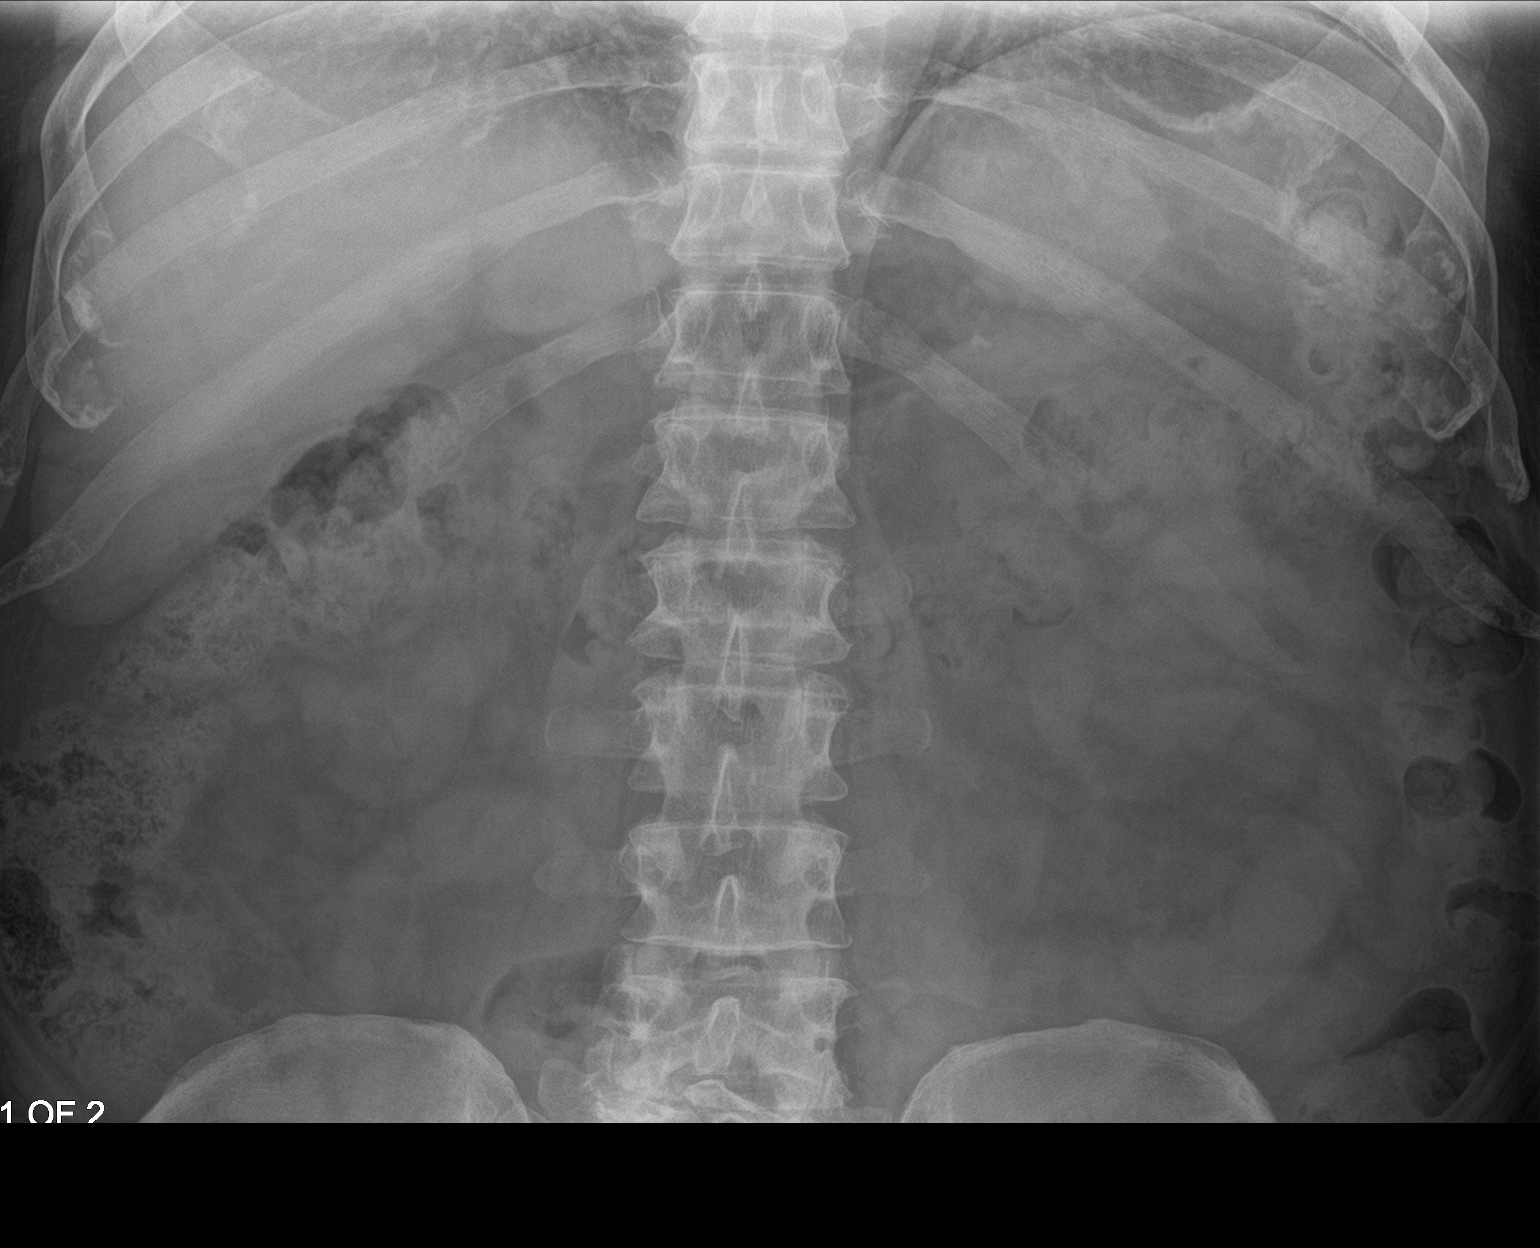

[abdomen kub (2 of 2)]
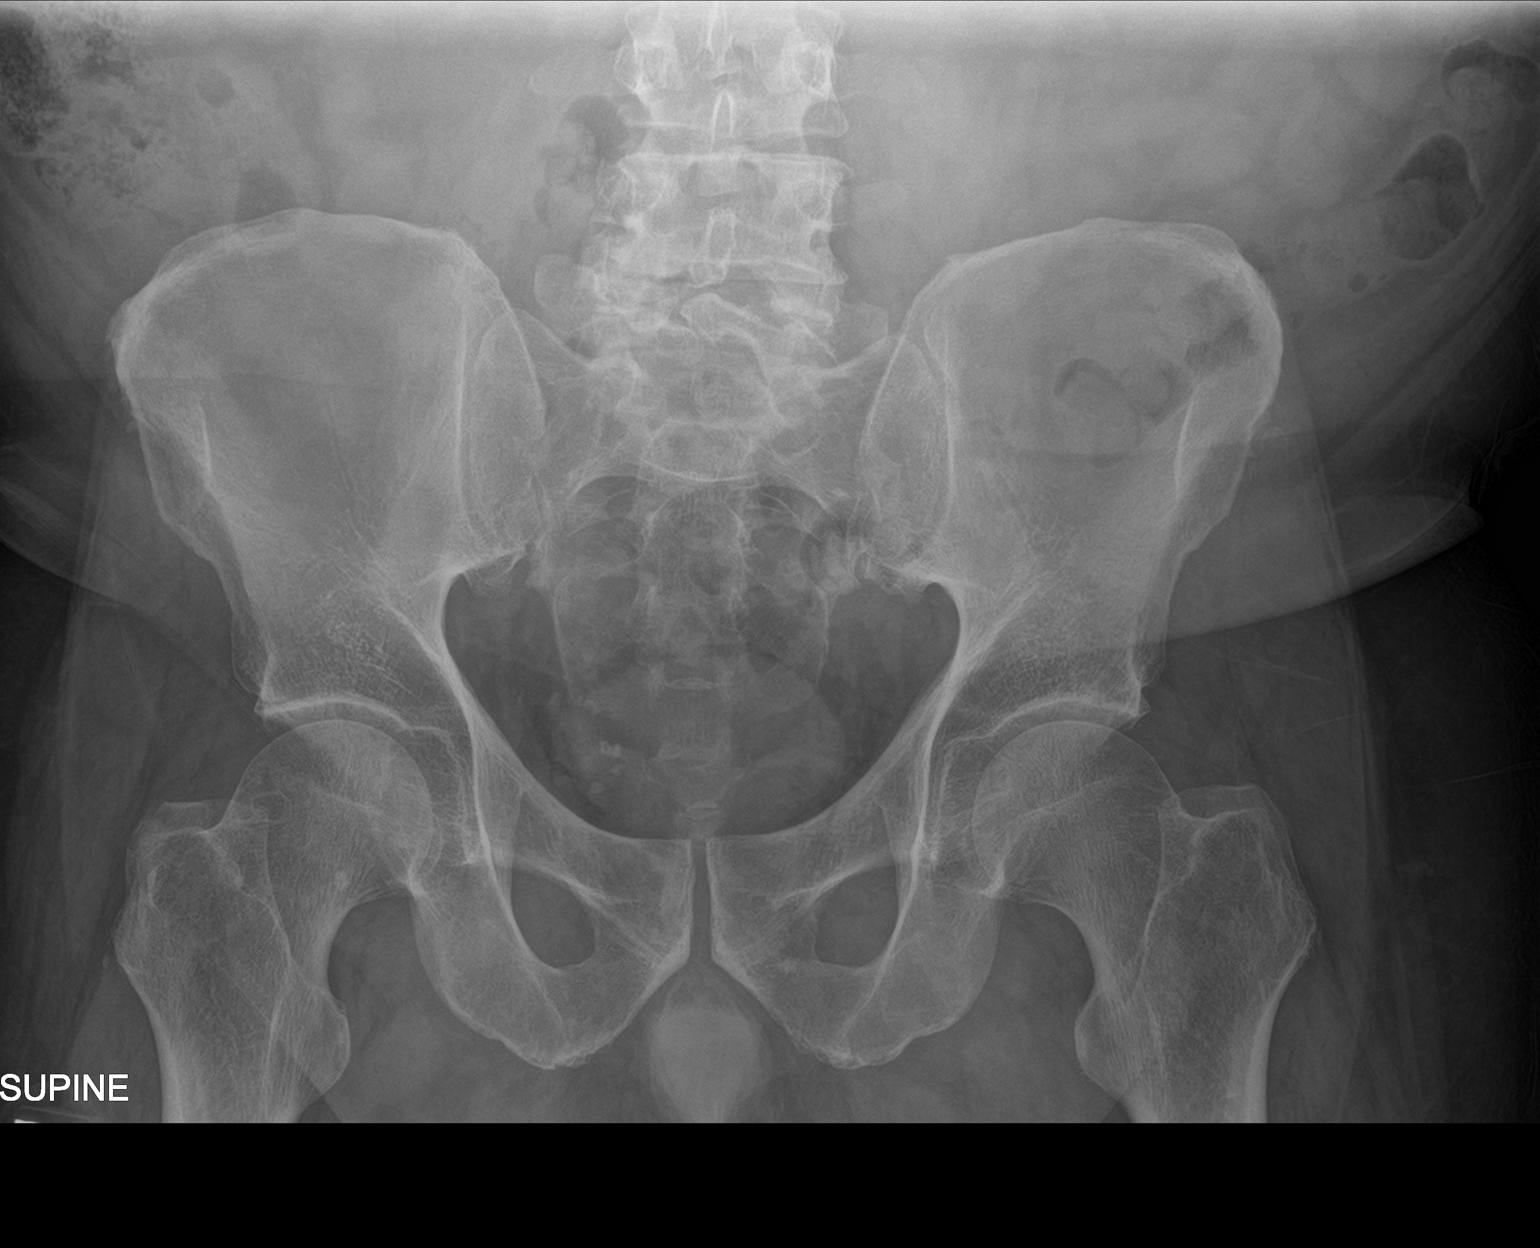

[2 of 2 positions shown; findings below may reference images not displayed]

FINDINGS: No radiopaque urinary tract calculi identified.

Moderate stool in the colon. Normal bowel gas pattern. No acute
skeletal abnormality.
IMPRESSION: No radiopaque urinary tract calculi.

## 2022-09-10 ENCOUNTER — Other Ambulatory Visit: Payer: Self-pay | Admitting: Student

## 2022-09-10 DIAGNOSIS — M7712 Lateral epicondylitis, left elbow: Secondary | ICD-10-CM

## 2022-09-21 ENCOUNTER — Ambulatory Visit
Admission: RE | Admit: 2022-09-21 | Discharge: 2022-09-21 | Disposition: A | Discharge status: Home / Self Care | Payer: Federal, State, Local not specified - PPO | Source: Ambulatory Visit | Attending: Student | Admitting: Student

## 2022-09-21 DIAGNOSIS — M7712 Lateral epicondylitis, left elbow: Secondary | ICD-10-CM

## 2022-11-13 ENCOUNTER — Encounter: Payer: Self-pay | Admitting: Urology

## 2022-11-13 ENCOUNTER — Ambulatory Visit
Admission: RE | Admit: 2022-11-13 | Discharge: 2022-11-13 | Disposition: A | Discharge status: Home / Self Care | Payer: Federal, State, Local not specified - PPO | Source: Ambulatory Visit | Attending: Urology | Admitting: Urology

## 2022-11-13 ENCOUNTER — Ambulatory Visit: Payer: Federal, State, Local not specified - PPO | Admitting: Urology

## 2022-11-13 ENCOUNTER — Other Ambulatory Visit: Payer: Self-pay | Admitting: *Deleted

## 2022-11-13 VITALS — BP 123/77 | HR 86 | Ht 66.0 in | Wt 223.0 lb

## 2022-11-13 DIAGNOSIS — N200 Calculus of kidney: Secondary | ICD-10-CM

## 2022-11-13 DIAGNOSIS — N401 Enlarged prostate with lower urinary tract symptoms: Secondary | ICD-10-CM

## 2022-11-13 MED ORDER — TAMSULOSIN HCL 0.4 MG PO CAPS: ORAL_CAPSULE | ORAL | 3 refills | Status: DC

## 2022-11-13 NOTE — Progress Notes (Signed)
I, Duke Salvia, acting as a scribe for Riki Altes, MD., have documented all relevant documentation on the behalf of Riki Altes, MD, as directed by  Riki Altes, MD while in the presence of Riki Altes, MD.   11/13/2022 10:52 AM   Douglas Parsons Nov 30, 1965 [id number]  Referring provider: Marguarite Arbour, MD [address] [facility] [place],  [place] [address]  Chief Complaint  Patient presents with   Nephrolithiasis    Urologic history: 1.  Recurrent stone disease -Last stone 2 mm right UVJ calculus 2018 -CT 11/2020 2mm right lower pole calculus -Metabolic eval 05/2017 w/ low urine volume and mild hypocitraturia  2.  BPH with lower urinary tract symptoms -Tamsulosin 0.4 mg daily   HPI: 57 y.o. male presents for annual follow-up.  Occasional passes small stone particles No bothersome LUTS Denies dysuria, gross hematuria Denies flank, abdominal or pelvic pain  PSA 01/01/2022 stable at 0.96.   PMH: Past Medical History:  Diagnosis Date   Allergy to alpha-gal    Atrial fibrillation (HCC)    GERD (gastroesophageal reflux disease)    Hypertension    Ischemic colitis [facility])     Surgical History: Past Surgical History:  Procedure Laterality Date   CARDIAC CATHETERIZATION     CARDIAC CATHETERIZATION     TONSILLECTOMY  1994    Home Medications:  Allergies as of 11/13/2022       Reactions   Alpha-gal Anaphylaxis   Atorvastatin Other (See Comments)   Lovastatin Other (See Comments)   Rosuvastatin Other (See Comments)   Simvastatin Other (See Comments)   Statins Other (See Comments)   Muscle aches        Medication List        Accurate as of November 13, 2022 10:52 AM. If you have any questions, ask your nurse or doctor.          aspirin EC 81 MG tablet Take 81 mg by mouth daily. Swallow whole.   clonazePAM 0.5 MG tablet Commonly known as: KLONOPIN Take 0.5 mg by mouth 2 (two) times daily as needed.   clopidogrel 75 MG  tablet Commonly known as: PLAVIX Take 1 tablet by mouth daily.   cyanocobalamin 1000 MCG tablet Commonly known as: VITAMIN B12 Take by mouth.   cyclobenzaprine 10 MG tablet Commonly known as: FLEXERIL TAKE 1/2 TO 1 TABLET BY MOUTH AT BEDTIMEAS NEEDED   EPINEPHrine 0.3 mg/0.3 mL Soaj injection Commonly known as: EPI-PEN Inject into the muscle.   ferrous sulfate 325 (65 FE) MG tablet Take 325 mg by mouth daily with breakfast.   HYDROcodone-acetaminophen 10-325 MG tablet Commonly known as: NORCO Take 1 tablet by mouth every 6 (six) hours as needed.   magnesium 30 MG tablet Take 30 mg by mouth daily.   meclizine 25 MG tablet Commonly known as: ANTIVERT Take 1 tablet by mouth 3 (three) times daily as needed.   metoprolol succinate 50 MG 24 hr tablet Commonly known as: TOPROL-XL Take 50 mg by mouth daily. Take with or immediately following a meal.   multivitamin capsule Take 1 capsule by mouth daily.   Repatha SureClick 140 MG/ML Soaj Generic drug: Evolocumab Inject into the skin.   tamsulosin 0.4 MG Caps capsule Commonly known as: FLOMAX TAKE 1 CAPSULE BY MOUTH ONCE DAILY 30 MINUTES AFTER LARGEST MEAL.   timolol 0.5 % ophthalmic gel-forming Commonly known as: TIMOPTIC-XR instill 1 drop into both eyes once daily   Vitamin D3  25 MCG (1000 UT) Caps Take by mouth 3 (three) times daily.        Allergies:  Allergies  Allergen Reactions   Alpha-Gal Anaphylaxis   Atorvastatin Other (See Comments)   Lovastatin Other (See Comments)   Rosuvastatin Other (See Comments)   Simvastatin Other (See Comments)   Statins Other (See Comments)    Muscle aches    Family History: Family History  Problem Relation Age of Onset   Arthritis Mother    COPD Mother    Hyperlipidemia Mother    Vision loss Mother    Cancer Father    Early death Father    Heart disease Father    Hyperlipidemia Father    Hypertension Father     Social History:  reports that he quit smoking  about 24 years ago. His smoking use included cigarettes. He has quit using smokeless tobacco. He reports that he does not currently use alcohol. He reports that he does not currently use drugs.   Physical Exam: BP 123/77   Pulse 86   Ht 5\' 6"  (1.676 m)   Wt 223 lb (101.2 kg)   BMI 35.99 kg/m   Constitutional:  Alert and oriented, No acute distress. HEENT: [place] AT Respiratory: Normal respiratory effort, no increased work of breathing. Psychiatric: Normal mood and affect.  Pertinent Imaging: KUB performed today personally reviewed and interpreted. Pelvic phlebolith is present. Faint calcification overlying the lower portion of the right renal outline.    Assessment & Plan:    1.  BPH with LUTS Stable LUTS on Tamsulosin, which was refilled. Continue annual follow up.  2.  Right nephrolithiasis Stable. Recommend continued fluid intake and return annually or as needed for recurrent, persistent flank pain.   I have reviewed the above documentation for accuracy and completeness, and I agree with the above.   Riki Altes, MD  [facility] [address] [place], [place] [address] [phone]

## 2023-06-20 ENCOUNTER — Ambulatory Visit: Payer: Federal, State, Local not specified - PPO

## 2023-06-20 DIAGNOSIS — Z1211 Encounter for screening for malignant neoplasm of colon: Secondary | ICD-10-CM | POA: Diagnosis present

## 2023-11-11 ENCOUNTER — Other Ambulatory Visit: Payer: Self-pay | Admitting: *Deleted

## 2023-11-11 DIAGNOSIS — N200 Calculus of kidney: Secondary | ICD-10-CM

## 2023-11-13 ENCOUNTER — Ambulatory Visit: Payer: Self-pay | Admitting: Urology

## 2023-11-13 ENCOUNTER — Encounter: Payer: Self-pay | Admitting: Urology

## 2023-11-13 ENCOUNTER — Ambulatory Visit
Admission: RE | Admit: 2023-11-13 | Discharge: 2023-11-13 | Disposition: A | Discharge status: Home / Self Care | Source: Ambulatory Visit | Attending: Urology | Admitting: Urology

## 2023-11-13 VITALS — BP 124/80 | HR 80 | Ht 66.0 in | Wt 217.0 lb

## 2023-11-13 DIAGNOSIS — N401 Enlarged prostate with lower urinary tract symptoms: Secondary | ICD-10-CM | POA: Diagnosis not present

## 2023-11-13 DIAGNOSIS — N200 Calculus of kidney: Secondary | ICD-10-CM | POA: Diagnosis not present

## 2023-11-13 MED ORDER — TAMSULOSIN HCL 0.4 MG PO CAPS: ORAL_CAPSULE | ORAL | 3 refills | Status: AC

## 2023-11-13 NOTE — Progress Notes (Signed)
 11/13/2023 9:18 AM   Douglas Parsons [birth year] [id number]  Referring provider: Yehuda Helms, MD [address] [facility] [place],  [place] [address]  Chief Complaint  Patient presents with   Nephrolithiasis    Urologic history: 1.  Recurrent stone disease -Last stone 2 mm right UVJ calculus 2018 -CT 11/2020 2mm right lower pole calculus -Metabolic eval 05/2017 w/ low urine volume and mild hypocitraturia  2.  BPH with lower urinary tract symptoms -Tamsulosin  0.4 mg daily   HPI: 58 y.o. male presents for annual follow-up.  Occasional passes small stone particles No bothersome LUTS Denies dysuria, gross hematuria Denies flank, abdominal or pelvic pain  PSA 01/30/2023 stable at 1.04   PMH: Past Medical History:  Diagnosis Date   Allergy to alpha-gal    Atrial fibrillation (HCC)    GERD (gastroesophageal reflux disease)    Hypertension    Ischemic colitis [facility])     Surgical History: Past Surgical History:  Procedure Laterality Date   CARDIAC CATHETERIZATION     CARDIAC CATHETERIZATION     TONSILLECTOMY  1994    Home Medications:  Allergies as of 11/13/2023       Reactions   Alpha-d-galactosidase Anaphylaxis   Alpha-gal Anaphylaxis   Heparin Anaphylaxis   Related to Alpha-gal   Yellow Jacket Venom Swelling   Atorvastatin Other (See Comments)   Lovastatin Other (See Comments)   Rosuvastatin Other (See Comments)   Simvastatin Other (See Comments)   Statins Other (See Comments)   Muscle aches        Medication List        Accurate as of November 13, 2023  9:18 AM. If you have any questions, ask your nurse or doctor.          STOP taking these medications    aspirin  EC 81 MG tablet   cyclobenzaprine  10 MG tablet Commonly known as: FLEXERIL    magnesium 30 MG tablet   meclizine 25 MG tablet Commonly known as: ANTIVERT   timolol 0.5 % ophthalmic gel-forming Commonly known as: TIMOPTIC-XR       TAKE these medications     clonazePAM 0.5 MG tablet Commonly known as: KLONOPIN Take 1 mg by mouth 2 (two) times daily as needed.   clopidogrel 75 MG tablet Commonly known as: PLAVIX Take 1 tablet by mouth daily.   cyanocobalamin 1000 MCG tablet Commonly known as: VITAMIN B12 Take by mouth.   EPINEPHrine  0.3 mg/0.3 mL Soaj injection Commonly known as: EPI-PEN Inject into the muscle.   fenofibrate 145 MG tablet Commonly known as: TRICOR Take 145 mg by mouth daily.   ferrous sulfate 325 (65 FE) MG tablet Take 325 mg by mouth daily with breakfast.   HYDROcodone-acetaminophen 10-325 MG tablet Commonly known as: NORCO Take 1 tablet by mouth every 6 (six) hours as needed.   metoprolol succinate 50 MG 24 hr tablet Commonly known as: TOPROL-XL Take 50 mg by mouth daily. Take with or immediately following a meal.   multivitamin capsule Take 1 capsule by mouth daily.   Repatha SureClick 140 MG/ML Soaj Generic drug: Evolocumab Inject into the skin.   tamsulosin  0.4 MG Caps capsule Commonly known as: FLOMAX  TAKE 1 CAPSULE BY MOUTH ONCE DAILY 30 MINUTES AFTER LARGEST MEAL.   Vitamin D3 25 MCG (1000 UT) Caps Take by mouth 3 (three) times daily.   Wegovy 2.4 MG/0.75ML Soaj Generic drug: Semaglutide-Weight Management Inject 2.4 mg into the skin once a week.  Allergies:  Allergies  Allergen Reactions   Alpha-D-Galactosidase Anaphylaxis   Alpha-Gal Anaphylaxis   Heparin Anaphylaxis    Related to Alpha-gal   Yellow Jacket Venom Swelling   Atorvastatin Other (See Comments)   Lovastatin Other (See Comments)   Rosuvastatin Other (See Comments)   Simvastatin Other (See Comments)   Statins Other (See Comments)    Muscle aches    Family History: Family History  Problem Relation Age of Onset   Arthritis Mother    COPD Mother    Hyperlipidemia Mother    Vision loss Mother    Cancer Father    Early death Father    Heart disease Father    Hyperlipidemia Father    Hypertension Father      Social History:  reports that he quit smoking about 25 years ago. His smoking use included cigarettes. He has quit using smokeless tobacco. He reports that he does not currently use alcohol. He reports that he does not currently use drugs.   Physical Exam: BP 124/80 (BP Location: Left Arm, Patient Position: Sitting, Cuff Size: Large)   Pulse 80   Ht 5\' 6"  (1.676 m)   Wt 217 lb (98.4 kg)   BMI 35.02 kg/m   Constitutional:  Alert and oriented, No acute distress. HEENT: [place] AT Respiratory: Normal respiratory effort, no increased work of breathing. Psychiatric: Normal mood and affect.  Pertinent Imaging: KUB performed today personally reviewed and interpreted. Pelvic phlebolith is present. Faint calcification overlying the lower portion of the right renal outline.  No significant changes from KUB June 2024    Assessment & Plan:    1.  BPH with LUTS Stable LUTS on Tamsulosin , which was refilled. Continue annual follow up.  2.  Right nephrolithiasis Stable. Recommend continued fluid intake and return annually or as needed for recurrent, persistent flank pain.   Geraline Knapp, MD  [facility] [address] [place], [place] [address] [phone]

## 2023-12-25 ENCOUNTER — Telehealth: Payer: Self-pay

## 2023-12-25 NOTE — Telephone Encounter (Signed)
 Pt called the triage line stating he thinks he is passing a kidney stone, pt states he feels some pain and pressure on his lower abdomen. Pt states he feels nauseous and can't eat, states this has been going on all week, hasn't been able to go to work. Pt states he is having urinary frequency, bright red blood in his urine,no blood clots, testicular pain. Pt was informed that unfoundedly we do not have any opening today or tomorrow. Advise pt to go to the ER. Pt stated he was going to contact his PCP.

## 2023-12-26 ENCOUNTER — Ambulatory Visit
Admission: RE | Admit: 2023-12-26 | Discharge: 2023-12-26 | Disposition: A | Discharge status: Home / Self Care | Source: Ambulatory Visit | Attending: Physician Assistant | Admitting: Physician Assistant

## 2023-12-26 ENCOUNTER — Other Ambulatory Visit: Payer: Self-pay | Admitting: Physician Assistant

## 2023-12-26 DIAGNOSIS — N200 Calculus of kidney: Secondary | ICD-10-CM

## 2023-12-26 NOTE — Progress Notes (Signed)
 No chief complaint on file.   HPI  Douglas Parsons is a 58 y.o. here for an acute issue.   He has a PMH of CAD, HTN, HLD, B12 deficiency, PVD, OSA, ischemic colitis who reports almost a week now of right flank pain with right mid abdominal pain associated with nausea, hematuria with history of renal stones on the right side.  Not resolving.    ROS  Pertinent items are noted in HPI.  Outpatient Encounter Medications as of 12/26/2023  Medication Sig Dispense Refill  . cholecalciferol (VITAMIN D3) 1000 unit tablet Take 1,000 Units by mouth 3 (three) times a day Per patient taking 5000 unit  tablet 1 time per day.    . clonazePAM (KLONOPIN) 0.5 MG tablet TAKE (1) TABLET BY MOUTH TWICE DAILY AS NEEDED 60 tablet 2  . clopidogreL (PLAVIX) 75 mg tablet TAKE (1) TABLET BY MOUTH EVERY DAY 90 tablet 1  . cyanocobalamin (VITAMIN B12) 1000 MCG tablet Take 1,000 mcg by mouth once daily.    . cyclobenzaprine  (FLEXERIL ) 10 MG tablet TAKE 1/2 TO 1 TABLET BY MOUTH AT BEDTIMEAS NEEDED 30 tablet 11  . EPINEPHrine  (EPIPEN ) 0.3 mg/0.3 mL auto-injector Use as directed 2 pen! 3  . evolocumab (REPATHA SURECLICK) 140 mg/mL PnIj INJECT 140 MG UNDER THE SKIN EVERY 14 DAYS 6 mL 3  . fenofibrate nanocrystallized (TRICOR) 145 MG tablet TAKE (1) TABLET BY MOUTH EVERY DAY 30 tablet 7  . HYDROcodone-acetaminophen (NORCO) 10-325 mg tablet Take 1 tablet by mouth every 6 (six) hours as needed for Pain 90 tablet 0  . magnesium 250 mg Tab Take 250 mg by mouth once daily.      . meclizine (ANTIVERT) 25 mg tablet Take 1 tablet (25 mg total) by mouth 3 (three) times daily as needed for Dizziness 90 tablet 1  . metoprolol SUCCinate (TOPROL-XL) 50 MG XL tablet TAKE (1) TABLET BY MOUTH TWICE DAILY 180 tablet 3  . multivitamin tablet Take 1 tablet by mouth once daily.    . semaglutide (WEGOVY) 2.4 mg/0.75 mL pen injector Inject 0.75 mLs (2.4 mg total) subcutaneously once a week 9 mL 3  . tamsulosin  (FLOMAX ) 0.4 mg capsule Take 0.4 mg by  mouth once daily     No facility-administered encounter medications on file as of 12/26/2023.    Allergies as of 12/26/2023 - Reviewed 11/26/2023  Allergen Reaction Noted  . Alpha-gal (galactose-alpha-1,3-galactose) Anaphylaxis 04/14/2018  . Heparin analogues Anaphylaxis 08/29/2020  . Yellow jacket venom Swelling 12/03/2017  . Crestor [rosuvastatin] Unknown 10/07/2013  . Lipitor [atorvastatin] Unknown 10/07/2013  . Lovastatin Unknown 10/07/2013  . Statins-hmg-coa reductase inhibitors Muscle Pain and Other (See Comments) 10/07/2013  . Zocor [simvastatin] Unknown 10/07/2013    Past Medical History:  Diagnosis Date  . Chickenpox   . Coronary atherosclerosis    Minor 3-vessel, card cath 09/2009  . Depression   . Hyperlipidemia   . Hypertension   . Ischemic colitis (CMS/HHS-HCC)   . 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   . Pneumonia   . Sleep apnea   . Ulcer    stomach ulcer    Past Surgical History:  Procedure Laterality Date  . TONSILLECTOMY  1993  . COLONOSCOPY  08/09/2013   Douglas Parsons. Douglas Parsons @ [facility] - Nml, rpt 10 yrs per Douglas Parsons  . Cardiac catheterization 09/2009      There were no vitals filed for this visit.  Physical Exam  General. Well appearing; NAD; VS  reviewed     HEENT: Sclera and conjunctiva clear; EOMI, Lungs. Respirations unlabored; clear to auscultation bilaterally. Cardiovascular. Heart regular rate and rhythm without murmurs, gallops, or rubs. Skin. Normal color and turgor Neurologic. Alert and oriented x3   Assessment and Plan 1. Nephrolithiasis History consistent with renal stone.  Not improving after a week.  Further evaluate with labs, CT. -     CT renal stone protocol inc CT abd and pelvis wo contrast; Future -     Urinalysis w/Microscopic -     Comprehensive Metabolic Panel (CMP) -     CBC w/auto Differential (5 Part)  2. Hematuria, gross   3. Nausea   4. Benign essential HTN     I have personally  performed this service.  [address] Douglas Parsons, Douglas Parsons

## 2024-02-23 ENCOUNTER — Telehealth: Payer: Self-pay | Admitting: *Deleted

## 2024-02-23 NOTE — Telephone Encounter (Signed)
 Patient called in today and states he is in lot of pain from his kidney stone  . He states he is having fever and chills, He states he was throwing up yesterday  but not today. I advised him if  he is having that  much pain and having  a high fevers, He needs to go to the ED .

## 2024-11-12 ENCOUNTER — Ambulatory Visit: Admitting: Urology
